# Patient Record
Sex: Female | Born: 1950
Health system: Southern US, Community
[De-identification: ages and names within clinical notes are randomized; demographics above are authoritative.]

## PROBLEM LIST (undated history)

## (undated) DIAGNOSIS — Z780 Asymptomatic menopausal state: Secondary | ICD-10-CM

## (undated) DIAGNOSIS — C801 Malignant (primary) neoplasm, unspecified: Secondary | ICD-10-CM

## (undated) DIAGNOSIS — Z923 Personal history of irradiation: Secondary | ICD-10-CM

## (undated) HISTORY — PX: ABDOMINAL HYSTERECTOMY: SHX81

## (undated) HISTORY — PX: TUBAL LIGATION: SHX77

## (undated) HISTORY — DX: Asymptomatic menopausal state: Z78.0

## (undated) HISTORY — DX: Personal history of irradiation: Z92.3

## (undated) HISTORY — DX: Malignant (primary) neoplasm, unspecified: C80.1

---

## 1989-03-28 HISTORY — PX: BUNIONECTOMY: SHX129

## 2004-08-11 ENCOUNTER — Other Ambulatory Visit: Admission: RE | Admit: 2004-08-11 | Discharge: 2004-08-11 | Payer: Self-pay | Admitting: Gynecology

## 2011-06-01 ENCOUNTER — Other Ambulatory Visit (HOSPITAL_COMMUNITY)
Admission: RE | Admit: 2011-06-01 | Discharge: 2011-06-01 | Disposition: A | Discharge status: Home / Self Care | Payer: BC Managed Care – PPO | Source: Ambulatory Visit | Attending: Gynecology | Admitting: Gynecology

## 2011-06-01 ENCOUNTER — Encounter: Payer: Self-pay | Admitting: Gynecology

## 2011-06-01 ENCOUNTER — Ambulatory Visit (INDEPENDENT_AMBULATORY_CARE_PROVIDER_SITE_OTHER): Payer: BC Managed Care – PPO | Admitting: Gynecology

## 2011-06-01 VITALS — BP 122/70 | Ht 63.75 in | Wt 125.0 lb

## 2011-06-01 DIAGNOSIS — N950 Postmenopausal bleeding: Secondary | ICD-10-CM

## 2011-06-01 DIAGNOSIS — Z1159 Encounter for screening for other viral diseases: Secondary | ICD-10-CM | POA: Insufficient documentation

## 2011-06-01 DIAGNOSIS — Z01419 Encounter for gynecological examination (general) (routine) without abnormal findings: Secondary | ICD-10-CM | POA: Insufficient documentation

## 2011-06-01 DIAGNOSIS — Z124 Encounter for screening for malignant neoplasm of cervix: Secondary | ICD-10-CM

## 2011-06-01 MED ORDER — MEGESTROL ACETATE 40 MG PO TABS: 40.0000 mg | ORAL_TABLET | Freq: Two times a day (BID) | ORAL | Status: AC

## 2011-06-01 NOTE — Progress Notes (Signed)
Patient is a 61 year old new patient to the practice that came as a result of postmenopausal bleeding which started yesterday. Patient has not seen a doctor in close to 10 years. Patient with no prior mammograms, colonoscopy, or bone density study. Patient does her monthly self breast examination. Patient currently on no medication. Patient denies any change in appetite or weight changes.  We discussed importance of part of her evaluation to include an endometrial biopsy in the office today with a followup sonohysterogram next week in the office.  Pelvic exam: Bartholin urethra Skene glands: Within normal limits Vagina: Atrophic changes some blood with mucus found in the vaginal vault Cervix: Blood-tinged mucoid material was present Uterus: Anteverted normal size shape and consistency Adnexa: No palpable masses or tenderness Rectal: Not examined no external lesions seen.  The cervix was cleansed with Betadine solution. A single-tooth tenaculum was placed on the anterior cervical lip. The uterus sounded to 6-1/2 cm. The Pipelle was introduced into the intrauterine cavity and moderate amount of tissue was obtained and submitted for histological evaluation. Patient tolerated procedure well and was given Aleve to take for cramping afterwards. Patient will return back next week for sonohysterogram. And she will be prescribed Megace to take 40 mg one by mouth twice a day for 7 days.  Assessment/plan: Postmenopausal bleeding etiology pending results of endometrial biopsy done today and sonohysterogram next week. Prescribed Megace 40 mg one by mouth twice a day for 7 days to stop her bleeding. Requisition to schedule mammogram was provided. At next visit will give her names of gastroenterologist for a scheduled colonoscopy as well as to schedule a bone density study here in the office. We did do a Pap smear today. All questions answered we'll follow accordingly.

## 2011-06-06 ENCOUNTER — Telehealth: Payer: Self-pay | Admitting: Gynecology

## 2011-06-06 ENCOUNTER — Other Ambulatory Visit: Payer: Self-pay | Admitting: Gynecology

## 2011-06-06 DIAGNOSIS — C55 Malignant neoplasm of uterus, part unspecified: Secondary | ICD-10-CM

## 2011-06-06 NOTE — Telephone Encounter (Signed)
I contacted Tracey Oneill this afternoon to inform her the results of her recent endometrial biopsy done in the office on March 6 as a result of her postmenopausal bleeding. The pathology report was as follows:  FINAL DIAGNOSIS Diagnosis Endometrium, biopsy - POSITIVE FOR ADENOCARCINOMA  I have discussed this case with Dr. Hazle Coca GYN oncologist at [facility] and he is going to follow her care for her staging procedure. She would need a chest x-ray PA and lateral along with abdominal pelvic CT scan as part of her staging preoperatively. I discussed this with Tracey Oneill and informed her that our office is making all the arrangements and we'll be in contact with her within the next couple of days. All questions rancher will follow accordingly.

## 2011-06-07 ENCOUNTER — Telehealth: Payer: Self-pay | Admitting: *Deleted

## 2011-06-07 ENCOUNTER — Other Ambulatory Visit: Payer: Self-pay | Admitting: *Deleted

## 2011-06-07 NOTE — Telephone Encounter (Signed)
Lm for patient to call.  Have appt for CT set at [facility] on 06/10/11 @ 11am.  Will need to be NPO 4 hours prior, need to pick up contrast prior to appt.  Will need to drink contrast 9am, and 10am. Will confirm with patient before scheduling appt with Dr. Noland Fordyce office.

## 2011-06-07 NOTE — Progress Notes (Signed)
Addended byValeda Malm L on: 06/07/2011 10:15 AM   Modules accepted: Orders

## 2011-06-07 NOTE — Telephone Encounter (Signed)
Message copied by Libby Maw on Tue Jun 07, 2011 10:35 AM ------      Message from: Ok Edwards      Created: Mon Jun 06, 2011  3:55 PM       Mychael Smock, I have spoken with this patient today to inform her of her recent diagnosis adenocarcinoma of the uterus. I've also spoken  with Dr. Hazle Coca GYN oncologist at [facility] and his office is waiting to hear from [place] so they can make the appointment for next week. Please schedule a chest x-ray PA and lateral along with abdominal pelvic CT scan with and without contrast for this patient as part of the staging procedure. Patient also waiting to hear from [place] for the studies as well as a followup appointment with Hazle Coca. Thanks

## 2011-06-07 NOTE — Telephone Encounter (Signed)
Patient informed appt information.  Faxed records to Dr. Noland Fordyce office awaiting appt time.

## 2011-06-08 ENCOUNTER — Other Ambulatory Visit: Payer: Self-pay | Admitting: *Deleted

## 2011-06-08 DIAGNOSIS — C55 Malignant neoplasm of uterus, part unspecified: Secondary | ICD-10-CM

## 2011-06-08 NOTE — Telephone Encounter (Signed)
appt set up with Dr. Tresa Endo on 06/20/11 @ 10:45.

## 2011-06-09 ENCOUNTER — Ambulatory Visit: Payer: Self-pay | Admitting: Women's Health

## 2011-06-10 ENCOUNTER — Ambulatory Visit (HOSPITAL_COMMUNITY)
Admission: RE | Admit: 2011-06-10 | Discharge: 2011-06-10 | Disposition: A | Discharge status: Home / Self Care | Payer: BC Managed Care – PPO | Source: Ambulatory Visit | Attending: Gynecology | Admitting: Gynecology

## 2011-06-10 DIAGNOSIS — N9489 Other specified conditions associated with female genital organs and menstrual cycle: Secondary | ICD-10-CM | POA: Insufficient documentation

## 2011-06-10 DIAGNOSIS — K7689 Other specified diseases of liver: Secondary | ICD-10-CM | POA: Insufficient documentation

## 2011-06-10 DIAGNOSIS — C55 Malignant neoplasm of uterus, part unspecified: Secondary | ICD-10-CM

## 2011-06-10 MED ORDER — IOHEXOL 300 MG/ML  SOLN
100.0000 mL | Freq: Once | INTRAMUSCULAR | Status: AC | PRN
  Administered 2011-06-10: 100 mL via INTRAVENOUS

## 2011-06-21 ENCOUNTER — Telehealth: Payer: Self-pay | Admitting: *Deleted

## 2011-06-21 NOTE — Telephone Encounter (Signed)
Pt called stating she has been having problems with sleeping and would like rx? Left message on pt vm to speak with JF about this on OV 06/23/10.

## 2011-06-23 ENCOUNTER — Ambulatory Visit: Payer: BC Managed Care – PPO | Admitting: Gynecology

## 2011-06-23 ENCOUNTER — Ambulatory Visit: Payer: BC Managed Care – PPO

## 2011-07-19 ENCOUNTER — Encounter: Payer: Self-pay | Admitting: Cardiovascular Disease

## 2011-07-19 ENCOUNTER — Ambulatory Visit (INDEPENDENT_AMBULATORY_CARE_PROVIDER_SITE_OTHER): Payer: BC Managed Care – PPO | Admitting: Cardiovascular Disease

## 2011-07-19 VITALS — BP 123/75 | HR 95 | Ht 62.0 in | Wt 121.0 lb

## 2011-07-19 DIAGNOSIS — C55 Malignant neoplasm of uterus, part unspecified: Secondary | ICD-10-CM

## 2011-07-19 DIAGNOSIS — Z01810 Encounter for preprocedural cardiovascular examination: Secondary | ICD-10-CM | POA: Insufficient documentation

## 2011-07-19 NOTE — Assessment & Plan Note (Signed)
She has no signs or symptoms of angina, CHF or arrythmias. She is very active at work and at home. Her physical examination is completely normal. EKG shows normal sinus rhythm with incomplete LBBB. This does not suggest ischemia. I have discussed an echocardiogram in the future to assess LV size and function, exclude structural heart disease. This does not have to be done before her surgical procedure. Her QT interval is prolonged. She is not on any provoking medications. I would avoid any QT prolonging agents with anesthesia or in the post-operative period. I will see her back as needed.

## 2011-07-19 NOTE — Progress Notes (Signed)
   History of Present Illness: 61 yo WF with h/o scarlet fever but no other chronic medical problems who is referred today for pre-operative risk assessment. She was recently diagnosed with uterine cancer. There are plans for hysterectomy in two days at [facility]. She tells me that she is anxious. She works full time at [employer] as a Psychologist, sport and exercise. She has no chest pain, SOB, palpitations, near syncope, syncope, dizziness. She feels well.   Primary Care Physician: None   Past Medical History  Diagnosis Date  . Cancer     malignant neoplasm of corpus uteri except  isthmus  . Postmenopausal     Past Surgical History  Procedure Date  . Bunionectomy 1991    LEFT  . Tubal ligation     No current outpatient prescriptions on file.    Allergies  Allergen Reactions  . Codeine Nausea And Vomiting    History   Social History  . Marital Status: Unknown    Spouse Name: N/A    Number of Children: 2  . Years of Education: N/A   Occupational History  . Nurse tech at [employer]    Social History Main Topics  . Smoking status: Never Smoker   . Smokeless tobacco: Never Used  . Alcohol Use: No  . Drug Use: No  . Sexually Active: No   Other Topics Concern  . Not on file   Social History Narrative  . No narrative on file    Family History  Problem Relation Age of Onset  . Cancer Sister     COLON  . Diabetes Sister     younger  . Diabetes Sister     older  . Stroke Father   . Heart attack Sister 36    Review of Systems:  As stated in the HPI and otherwise negative.   BP 123/75  Pulse 95  Ht 5\' 2"  (1.575 m)  Wt 121 lb (54.885 kg)  BMI 22.13 kg/m2  Physical Examination: General: Well developed, well nourished, NAD HEENT: OP clear, mucus membranes moist SKIN: warm, dry. No rashes. Neuro: No focal deficits Musculoskeletal: Muscle strength 5/5 all ext Psychiatric: Mood and affect normal Neck: No JVD, no carotid bruits, no thyromegaly, no  lymphadenopathy. Lungs:Clear bilaterally, no wheezes, rhonci, crackles Cardiovascular: Regular rate and rhythm. No murmurs, gallops or rubs. Abdomen:Soft. Bowel sounds present. Non-tender.  Extremities: No lower extremity edema. Pulses are 2 + in the bilateral DP/PT.  EKG:NSR, rate 95 bpm.  Incomplete LBBB. QTc 512 ms.

## 2011-07-19 NOTE — Patient Instructions (Signed)
The current medical regimen is effective;  continue present plan and medications.  Follow up as needed 

## 2011-08-23 ENCOUNTER — Telehealth: Payer: Self-pay | Admitting: Oncology

## 2011-08-23 NOTE — Telephone Encounter (Signed)
Talked to pt, gave her appt date and location, gave her telephone number also faxed confirmation to [facility] oncology

## 2011-08-26 ENCOUNTER — Ambulatory Visit (HOSPITAL_BASED_OUTPATIENT_CLINIC_OR_DEPARTMENT_OTHER): Payer: BC Managed Care – PPO | Admitting: Lab

## 2011-08-26 ENCOUNTER — Ambulatory Visit (HOSPITAL_BASED_OUTPATIENT_CLINIC_OR_DEPARTMENT_OTHER): Payer: BC Managed Care – PPO | Admitting: Oncology

## 2011-08-26 ENCOUNTER — Other Ambulatory Visit: Payer: Self-pay | Admitting: *Deleted

## 2011-08-26 ENCOUNTER — Telehealth: Payer: Self-pay | Admitting: Oncology

## 2011-08-26 ENCOUNTER — Ambulatory Visit: Payer: BC Managed Care – PPO

## 2011-08-26 ENCOUNTER — Other Ambulatory Visit: Payer: BC Managed Care – PPO | Admitting: Lab

## 2011-08-26 ENCOUNTER — Encounter: Payer: Self-pay | Admitting: Oncology

## 2011-08-26 VITALS — BP 128/71 | HR 76 | Temp 97.7°F | Ht 62.0 in | Wt 118.2 lb

## 2011-08-26 DIAGNOSIS — C541 Malignant neoplasm of endometrium: Secondary | ICD-10-CM

## 2011-08-26 DIAGNOSIS — C549 Malignant neoplasm of corpus uteri, unspecified: Secondary | ICD-10-CM

## 2011-08-26 DIAGNOSIS — N950 Postmenopausal bleeding: Secondary | ICD-10-CM

## 2011-08-26 LAB — CBC WITH DIFFERENTIAL/PLATELET
BASO%: 0.3 % (ref 0.0–2.0)
Basophils Absolute: 0 10*3/uL (ref 0.0–0.1)
EOS%: 2.2 % (ref 0.0–7.0)
HGB: 12.9 g/dL (ref 11.6–15.9)
MCH: 29.3 pg (ref 25.1–34.0)
MCHC: 32.7 g/dL (ref 31.5–36.0)
MONO#: 0.8 10*3/uL (ref 0.1–0.9)
RDW: 13.1 % (ref 11.2–14.5)
WBC: 10 10*3/uL (ref 3.9–10.3)
lymph#: 2.8 10*3/uL (ref 0.9–3.3)

## 2011-08-26 MED ORDER — DEXAMETHASONE 4 MG PO TABS: ORAL_TABLET | ORAL | Status: DC

## 2011-08-26 MED ORDER — LORAZEPAM 0.5 MG PO TABS: ORAL_TABLET | ORAL | Status: DC

## 2011-08-26 MED ORDER — ONDANSETRON HCL 8 MG PO TABS: 8.0000 mg | ORAL_TABLET | Freq: Two times a day (BID) | ORAL | Status: DC | PRN

## 2011-08-26 NOTE — Progress Notes (Signed)
[facility] NEW PATIENT EVALUATION   Name: Tracey Oneill Date: 08/26/2011 MRN: [id number] DOB: [birth year]  REFERRING PHYSICIAN: Festus Holts, MD (gyn oncology [facility]) Physicians: J.Lily Peer, C.McAlhany   REASON FOR REFERRAL: IIIC endometrial carcinoma, for consideration of adjuvant therapy.     HISTORY OF PRESENT ILLNESS:Tracey Oneill is a 61 y.o. female, seen in consultation for consideration of adjuvant therapy for recently diagnosed IIIC endometrial carcinoma, as she prefers treatment closer to her home in [place]. She is accompanied by friend Clydie Braun. Patient had not had regular medical care in years due to lack of insurance, tho she had seemed very healthy. Just after she obtained insurance coverage she had new onset of postmenopausal bleeding and was seen promptly by Dr Reynaldo Minium on 06-01-11. She had endometrial biopsy, with pathology ([id number]) by [facility] Pathology showing adenocarcinoma with immunohistochemistry favoring endometrial, thought FIGO grade II. She had CT AP at [facility] on 06-10-11, with lung bases clear, no ascites, an ill-defined 2.9 x 3.5 x 3.4 cm mass in right uterine fundus with suspected deep myometrial invasion, no ascites, no retroperitoneal or pelvic adenopathy, no adnexal masses. Other organs unremarkable with exception of 7 mm lesion in anterior right hepatic lobe of uncertain significance. CXR also at [facility] on 06-10-11 unremarkable. She had preoperative cardiology consult by Dr.Christopher McAlhany. She was referred to Dr Festus Holts, with pathology reviewed and confirmed. She had total robotic hysterectomy, BSO and bilateral pelvic lymph node dissection at [facility] 07-21-2011, tolerated well. Pathology [id number]) showed endometroid adenocarcinoma invading outer half of myometrium 2 cm where wall thickness 2.2 cm, FIGO grade 2, no LVSI identified. One of 6 right pelvic nodes and 0 of 7 left pelvic nodes were involved, cervix and  bilateral tubes and ovaries without malignancy. Patient's postoperative course has been unremarkable. She was seen back by Dr Tresa Endo for postoperative visit on 08-08-2011, however the patient cannot tell me what was recommended and does not believe that she has another follow up appointment. Per Dr.Kelly's note, he recommended treatment with chemotherapy and/or radiation therapy, possibly on GOG 258, with patient and family to meet with research nurse and return visit 2 weeks to discuss plan of care. Patient does not recall meeting with research nurse. Patient continues to have some minimal vaginal spotting, not enough to require a pad, and some low pelvic discomfort when she sits for extended time. She has had no fever, is eating regular diet, was constipated x 1 day with bowels now moving daily without laxative or stool softners, no bladder symptoms, no LE swelling. She has some minimal numbness left anterior upper thigh only. 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  REVIEW OF SYSTEMS: otherwise no headaches, good visual acuity with reading glasses, no sinus symptoms, no dental problems, no difficulty hearing, no respiratory symptoms, no cardiac symptoms, has not had mammograms in years, no thyroid symptoms, usual weight usual 118 - 120 lbs with weight loss due to stress before surgery. No GERD symptoms, no nausea or vomiting, no swelling LE, was not on prophylactic anticoagulation. No other bleeding. No history of blood clots. ALLERGIES: Codeine causing nausea/vomiting.  PAST MEDICAL HISTORY: no known medical problems. No mammograms x years, has never had colonoscopy. BTL 37 years ago, bunionectomy 1991, TAH/BSO/pelvic nodes 07-21-11.   CURRENT MEDICATIONS: prn ibuprofen only   SOCIAL HISTORY: originally from [place], in [place] x 22 years. Lives alone, with daughter, son and 3 grandchildren/ 1 step grand all in this area.  Has worked as Psychologist, sport and exercise at  [employer] for 10 years. No cigarettes, no ETOH, no transfusions. Is friend of our previous transcriptionist Clydie Braun.  FAMILY HISTORY: Father died with CVA, mother died with sepsis. Sister with colon ca age 52 treated with surgery only and doing well. Brother died cirrhosis. Sister died cardiac disease and diabetes. 2 other sisters with DM. Daughter age 42 and son age 40 healthy.  LABORATORY DATA:  CBC available after visit: WBC 10.0, ANC 6.1, Hgb 12.9, plt 300k, MCV 89.6, differential not remarkable.  CMET sent and pending  RADIOGRAPHY: CT AP  06-10-11 as above CXR  06-10-11 as above    PHYSICAL EXAM:  height is 5\' 2"  (1.575 m) and weight is 118 lb 3.2 oz (53.615 kg). Her oral temperature is 97.7 F (36.5 C). Her blood pressure is 128/71 and her pulse is 76.  Easily ambulatory, looks comfortable. HEENT: PERRL, not icteric. Normal hair. Oral mucosa moist and clear. Neck supple without thyroid mass or JVD. No cervical supraclavicular or axillary adenopathy, no inguinal adenopathy. Breasts without masses, skin changes or nipple findings, axillae unremarkable. Lungs clear to auscultation and percussion. Back not tender to palpation. Heart RRR without murmur or gallop. Abdomen soft and nontender, normal bowel sounds, not distended. Incisions for robotic surgery all well-healed. No swelling, cords or tenderness LE. Neuro CN, motor, sensory, cerebellar nonfocal. Skin without rashes or bruising. Peripheral veins look adequate for IV access.   I have talked with patient and her friend about circumstances surrounding the diagnosis, the surgery and pathology information as above and recommendation for adjuvant therapy for this IIIC endometrial carcinoma. We have discussed chemotherapy and radiation in general, including standard treatment off study with 6 cycles of platin/taxane chemotherapy often with radiation. We have also discussed GOG 258 which is also available here. We have discussed outpatient  administration and follow up of chemotherapy, side effects including hair loss, nausea, low blood counts. By completion of our discussion she is in agreement with attending the chemotherapy education class and will consider meeting with our research nurse re GOG study. As she is not working on 08-29-11, we have been able to schedule the chemotherapy education class then, to be followed by another discussion with me. We have called in zofran, ativan (which she used remotely for anxiety) and decadron. We have not made referral to RT and have not set up chemotherapy appointment as yet. Patient was comfortable with this discussion, had her questions answered to her satisfaction and understands that she can call at any time if needed.   IMPRESSION/PLAN:  1.IIIC FIGO grade 2 endometroid adenocarcinoma: post surgery 07-21-11, now for adjuvant therapy. 2. Colon cancer in sister: needs colonoscopy 3.overdue mammograms   Zaliyah Meikle P, MD 08/26/2011 8:13 PM

## 2011-08-26 NOTE — Telephone Encounter (Signed)
Gave pt appt for 08/29/10 MD and chemo class, sent pt to lab today

## 2011-08-26 NOTE — Telephone Encounter (Signed)
Opened in error

## 2011-08-26 NOTE — Patient Instructions (Signed)
Chemotherapy teaching class on Monday 6-3, then Dr Darrold Span will see you to finalize plans. Please bring your work schedule if you have it.  We will call in prescriptions to [facility] [address], for zofran (ondansetron), ativan (lorazepam), decadron (dexamethasone). You'll need to pick these up at least the day before chemotherapy.

## 2011-08-27 LAB — COMPREHENSIVE METABOLIC PANEL
ALT: 8 U/L (ref 0–35)
AST: 14 U/L (ref 0–37)
Albumin: 4.4 g/dL (ref 3.5–5.2)
Alkaline Phosphatase: 75 U/L (ref 39–117)
Potassium: 4.1 mEq/L (ref 3.5–5.3)
Sodium: 140 mEq/L (ref 135–145)
Total Protein: 6.8 g/dL (ref 6.0–8.3)

## 2011-08-29 ENCOUNTER — Other Ambulatory Visit: Payer: BC Managed Care – PPO

## 2011-08-29 ENCOUNTER — Telehealth: Payer: Self-pay | Admitting: Oncology

## 2011-08-29 ENCOUNTER — Encounter: Payer: Self-pay | Admitting: Oncology

## 2011-08-29 ENCOUNTER — Ambulatory Visit (HOSPITAL_BASED_OUTPATIENT_CLINIC_OR_DEPARTMENT_OTHER): Payer: BC Managed Care – PPO | Admitting: Oncology

## 2011-08-29 VITALS — BP 135/66 | HR 75 | Temp 97.3°F | Ht 62.0 in | Wt 119.7 lb

## 2011-08-29 DIAGNOSIS — C541 Malignant neoplasm of endometrium: Secondary | ICD-10-CM

## 2011-08-29 DIAGNOSIS — C549 Malignant neoplasm of corpus uteri, unspecified: Secondary | ICD-10-CM

## 2011-08-29 NOTE — Telephone Encounter (Signed)
Talked to pt, gave her appt for 6/12 with Dr. Nelly Rout @ 1200 noon

## 2011-08-29 NOTE — Progress Notes (Signed)
OFFICE PROGRESS NOTE Date of Visit 08-29-11 Physicians: J.Madaline Savage, C.McAlhany  INTERVAL HISTORY:  Patient is seen, together with friend, to complete planning for adjuvant chemotherapy for endometrial carcinoma. She attended chemotherapy education class this am for standard taxol/ carboplatin chemotherapy, as she has decided that she does not want to consider treatment on protocol GOG 258. She requests all of treatment and follow up be done in [place], so I will also refer to Dr Antony Blackbird in radiation oncology and gyn oncology also in [place]. Just after she obtained medical insurance coverage,  she had new onset of postmenopausal bleeding and was seen promptly by Dr Reynaldo Minium on 06-01-11. She had endometrial biopsy, with pathology ([id number]) by [facility] Pathology showing adenocarcinoma with immunohistochemistry favoring endometrial, thought FIGO grade II. She had CT AP at [facility] on 06-10-11, with lung bases clear, no ascites, an ill-defined 2.9 x 3.5 x 3.4 cm mass in right uterine fundus with suspected deep myometrial invasion, no ascites, no retroperitoneal or pelvic adenopathy, no adnexal masses. Other organs unremarkable with exception of 7 mm lesion in anterior right hepatic lobe of uncertain significance. CXR also at [facility] on 06-10-11 unremarkable. She had preoperative cardiology consult by Dr.Christopher McAlhany. She was referred to Dr Festus Holts in gyn oncology at [facility], with pathology reviewed and confirmed. She had total robotic hysterectomy, BSO and bilateral pelvic lymph node dissection at [facility] 07-21-2011, tolerated well. Pathology [id number]) showed endometroid adenocarcinoma invading outer half of myometrium 2 cm where wall thickness 2.2 cm, FIGO grade 2, no LVSI identified. One of 6 right pelvic nodes and 0 of 7 left pelvic nodes were involved, cervix and bilateral tubes and ovaries without malignancy. Patient's postoperative course has been  unremarkable. She was seen back by Dr Tresa Endo for postoperative visit on 08-08-2011. Per Dr.Kelly's note, he recommended treatment with chemotherapy and/or radiation therapy, possibly on GOG 258, with patient and family to meet with research nurse and return visit 2 weeks to discuss plan of care. Patient did not recall meeting with Dr.Kelly's research nurse and was not aware of any scheduled follow up with [facility] when I saw her in consultation last week.  Patient is anxious about starting chemotherapy and has questions about possible radiation therapy, but otherwise has had no problems since I saw her on 08-26-11. 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 Appetite is improved, still some minimal vaginal spotting, bowels moving, no fever or symptoms of infection. Remainder of 10 point Review of Systems negative. She has brought work schedule thru June. She is off every other weekend and various other days such that we will start chemotherapy on June 14 (off June 14 - 17). I am glad to complete FMLA papers also, which may help as we try to stay on schedule with treatments.  Objective:  Vital signs in last 24 hours:  BP 135/66  Pulse 75  Temp(Src) 97.3 F (36.3 C) (Oral)  Ht 5\' 2"  (1.575 m)  Wt 119 lb 11.2 oz (54.296 kg)  BMI 21.89 kg/m2 Weight is up 1.5 lbs. Easily mobile, looks comfortable. Respirations not labored RA. No LE swelling. Did not repeat physical exam otherwise today.  Lab Results: CBC and CMET from 08-26-11 all entirely normal as noted. Studies/Results:  No results found.  Medications: I have reviewed the patient's current medications. She now has prescriptions for premed decadron for cycle 1, lorazepam and ondansetron for nausea. I have given  her written and oral instructions for premed decadron 20 mg with food 12 hrs and 6 hrs prior to taxol, and for the antiemetics (will use ativan night of rx and zofran am after rx  whether or not any nausea, and otherwise as directed prn). She understands that she can call at any time if needed. I will see her back on 6-17 and 6-25, again coordinating with her days off, or sooner if needed. 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 Will add gCSF, timing depending on counts and taxol aches.  Patient and friend are comfortable with discussion and plan. >50% of visit spent in discussion and coordination of care. Assessment/Plan: 1.IIIC grade 2 endometrial adenocarcinoma: post surgery 07-21-11 and to begin adjuvant taxol/carboplatin on 09-09-11. Referral to RT, timing of RT in relation to chemotherapy to be decided. She would like gyn oncology follow up also in [place], as she lives and works in [place].  2.Colon cancer in sister: needs colonoscopy at least after completion of present treatment 3.overdue mammograms, which still needs to be addressed.  Patient was comfortable with information today and in agreement with plan.  Reece Packer, MD   08/29/2011, 9:37 PM

## 2011-08-29 NOTE — Patient Instructions (Addendum)
First chemotherapy will be Friday June 14 at 1130  12 hours before chemo, at 1130 pm on Thurs June 13, take decadron (dexamethasone, steroid) 5 tablets (=20 mg) with food Again 6 hrs before chemo, at 5:30 AM on June 14, take 5 steroid tablets with food.  Night of chemo, whether or not any nausea, take ativan (lorazepam) at bedtime AM after chemo, whether or not any nausea, take zofran (ondansetron).  You can take additional doses of the nausea medicine per instructions on labels if any nausea.  Call if questions about medicines or anything else   [phone]

## 2011-08-31 ENCOUNTER — Ambulatory Visit
Admission: RE | Admit: 2011-08-31 | Discharge: 2011-08-31 | Disposition: A | Discharge status: Home / Self Care | Payer: BC Managed Care – PPO | Source: Ambulatory Visit | Attending: Radiation Oncology | Admitting: Radiation Oncology

## 2011-08-31 ENCOUNTER — Encounter: Payer: Self-pay | Admitting: Radiation Oncology

## 2011-08-31 VITALS — BP 116/72 | HR 95 | Temp 98.2°F | Resp 18 | Ht 63.5 in | Wt 120.3 lb

## 2011-08-31 DIAGNOSIS — Z510 Encounter for antineoplastic radiation therapy: Secondary | ICD-10-CM | POA: Insufficient documentation

## 2011-08-31 DIAGNOSIS — Z79899 Other long term (current) drug therapy: Secondary | ICD-10-CM | POA: Insufficient documentation

## 2011-08-31 DIAGNOSIS — C541 Malignant neoplasm of endometrium: Secondary | ICD-10-CM

## 2011-08-31 DIAGNOSIS — C549 Malignant neoplasm of corpus uteri, unspecified: Secondary | ICD-10-CM | POA: Insufficient documentation

## 2011-08-31 NOTE — Progress Notes (Signed)
HERE TODAY FOR CONSULT OF ENDOMETRIAL CANCER.  HAD TOTAL HYSTERECTOMY AT [facility] ON July 21, 2011.   NO C/O TODAY, NO PAIN, BOWELS ARE WORKING OK.  SHE IS TO START CHEMO CARBO / TAXOL  NEXT Friday WITH DR. Darrold Span.    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

## 2011-08-31 NOTE — Progress Notes (Signed)
Radiation Oncology         [phone] ________________________________  Initial outpatient Consultation  Name: Tracey Oneill MRN: [id number]  Date: 08/31/2011  DOB: 1950-05-29  CC:No primary provider on file.  Reece Packer, MD   REFERRING PHYSICIAN: Reece Packer, MD  DIAGNOSIS: The encounter diagnosis was Endometrial carcinoma.  HISTORY OF PRESENT ILLNESS::Tracey Oneill is a 61 y.o. female who is   seen out of the courtesy of Dr. Darrold Span for an opinion concerning radiation therapy as part management of patient's advanced endometrial cancer.  The patient presented with a several month history of vaginal bleeding. She was subsequently seen by Dr. Reynaldo Minium and endometrial biopsy revealed adenocarcinoma. . Patient subsequently underwent staging workup with a chest x-ray and CT scan of abdomen and pelvis. A 3.5 cm ill-defined mass within the right uterine fundus was noted with suspected deep myometrial invasion. Patient was referred to Dr. Festus Holts at [facility]. The patient proceeded to undergo total robotic hysterectomy, bilateral salpingo-oophorectomy and bilateral pelvic lymph node dissection. The surgery was performed on 07/21/2011. On pathologic review the patient was found to have an endometrioid adenocarcinoma invading the outer half of the myometrium to a 2 cm thick  where the wall thickness was 2.2 cm. FIGO grade 2. There was no lymphovascular space invasion noted. One out of 6 pelvic lymph nodes on the right showed metastatic disease. There was no spread in 7 left pelvic lymph nodes. Patient had an uneventful postoperative course. She was then referred to Dr. Darrold Span who recommended adjuvant treatment. The patient is now seen radiation oncology for further evaluation.  PREVIOUS RADIATION THERAPY: No  PAST MEDICAL HISTORY:  has a past medical history of Cancer and Postmenopausal.    PAST SURGICAL HISTORY: Past Surgical History  Procedure Date  .  Bunionectomy 1991    LEFT  . Tubal ligation     FAMILY HISTORY: family history includes Cancer in her sister; Diabetes in her sisters; Heart attack (age of onset:58) in her sister; and Stroke in her father.  SOCIAL HISTORY:  reports that she has never smoked. She has never used smokeless tobacco. She reports that she does not drink alcohol or use illicit drugs.  ALLERGIES: Codeine  MEDICATIONS:  Current Outpatient Prescriptions  Medication Sig Dispense Refill  . dexamethasone (DECADRON) 4 MG tablet Take 5 tablets with food 12 hours and 6 hours prior to Taxol treatment.  10 tablet  0  . ibuprofen (ADVIL,MOTRIN) 800 MG tablet Take 800 mg by mouth every 8 (eight) hours as needed.      Marland Kitchen LORazepam (ATIVAN) 0.5 MG tablet Take 1 tablet under the tongue or swallow every 4-6 hours as needed for nausea. Will make drowsy.  20 tablet  0  . ondansetron (ZOFRAN) 8 MG tablet Take 1-2 tablets (8-16 mg total) by mouth every 12 (twelve) hours as needed for nausea. Will not make you sleepy.  30 tablet  0    REVIEW OF SYSTEMS:  A 15 point review of systems is documented in the electronic medical record. This was obtained by the nursing staff. However, I reviewed this with the patient to discuss relevant findings and make appropriate changes. She denies any pelvic or abdominal pain or flank pain. This denies any urination difficulties or bowel complaints. She denies any vaginal bleeding at this time. Patient is back to working full-time. She denies any swelling in her lower extremities. Patient does wears support stockings while working.   PHYSICAL EXAM:  height is  5' 3.5" (1.613 m) and weight is 120 lb 4.8 oz (54.568 kg). Her oral temperature is 98.2 F (36.8 C). Her blood pressure is 116/72 and her pulse is 95. Her respiration is 18.  the pupils are equal round reactive to light. The extraocular eye movements are intact. The tongue is midline. This is secondary infection noted in the oral cavity or posterior  pharynx. Examination of the neck and supraclavicular region reveals no evidence of adenopathy. Axillary areas are free of adenopathy your examination of the lungs with some to be clear. The heart has a regular rhythm and rate. Examination of the abdomen fails to be soft and nontender with normal bowel sounds. There is no obvious hepatosplenomegaly. The inguinal areas are free of adenopathy. Patient has small scars along the abdominal area from her robotic surgery. A pelvic exam as not performed today but will be performed  close to her simulation and treatment. Examination of extremities reveals no significant cyanosis clubbing or edema. On neurological examination motor strength is 5 out of 5 in the proximal distal muscle groups in the upper lower extremities.  LABORATORY DATA:  Lab Results  Component Value Date   WBC 10.0 08/26/2011   HGB 12.9 08/26/2011   HCT 39.4 08/26/2011   MCV 89.6 08/26/2011   PLT 300 08/26/2011   Lab Results  Component Value Date   NA 140 08/26/2011   K 4.1 08/26/2011   CL 107 08/26/2011   CO2 22 08/26/2011   Lab Results  Component Value Date   ALT 8 08/26/2011   AST 14 08/26/2011   ALKPHOS 75 08/26/2011   BILITOT 0.6 08/26/2011     RADIOGRAPHY: as above    IMPRESSION: Stage IIIc endometrial carcinoma. Patient would be at risk for pelvic recurrence and in light of this would recommend postoperative radiation therapy as part of her overall management. I discussed the overall treatment course,side effects and potential long-term toxicities of radiation therapy in the situation with the patient and her friend. She appears to understand and wishes to proceed with planned course of treatment. In addition given the deep myometrial invasion the patient may be at increased risk for vaginal cuff recurrence and I would also recommend consideration for intracavitary brachytherapy treatments as part of her overall management after she has completed her external beam treatments.  PLAN:   The patient. will initially proceed with her adjuvant chemotherapy.  Radiation therapy they may be "sandwiched" in between her chemotherapy cycles  depending on Dr. Precious Reel recommendations.   I spent 60 minutes minutes face to face with the patient and more than 50% of that time was spent in counseling and/or coordination of care.   ------------------------------------------------    Billie Lade, PhD, MD

## 2011-09-01 ENCOUNTER — Telehealth: Payer: Self-pay | Admitting: *Deleted

## 2011-09-01 NOTE — Progress Notes (Signed)
Encounter addended by: Gerlene Burdock on: 09/01/2011  8:47 AM<BR>     Documentation filed: Charges VN

## 2011-09-01 NOTE — Telephone Encounter (Signed)
 Sink called for Dr Roselind Messier. He would like a copy of the pathology report from the robotic hysterectomy performed at [facility] on 07/21/11. He does not see it in EPIC and thinks you might have a copy.

## 2011-09-06 ENCOUNTER — Telehealth: Payer: Self-pay | Admitting: Oncology

## 2011-09-06 NOTE — Telephone Encounter (Signed)
Called pt and left message, reminding her of appt on 6/14th , advised pt to get calendar .

## 2011-09-07 ENCOUNTER — Encounter: Payer: Self-pay | Admitting: Gynecologic Oncology

## 2011-09-07 ENCOUNTER — Ambulatory Visit: Payer: BC Managed Care – PPO | Attending: Gynecologic Oncology | Admitting: Gynecologic Oncology

## 2011-09-07 VITALS — BP 102/68 | HR 70 | Temp 97.8°F | Resp 16 | Ht 63.5 in | Wt 118.0 lb

## 2011-09-07 DIAGNOSIS — Z90710 Acquired absence of both cervix and uterus: Secondary | ICD-10-CM | POA: Insufficient documentation

## 2011-09-07 DIAGNOSIS — C549 Malignant neoplasm of corpus uteri, unspecified: Secondary | ICD-10-CM | POA: Insufficient documentation

## 2011-09-07 DIAGNOSIS — Z79899 Other long term (current) drug therapy: Secondary | ICD-10-CM | POA: Insufficient documentation

## 2011-09-07 DIAGNOSIS — C541 Malignant neoplasm of endometrium: Secondary | ICD-10-CM

## 2011-09-07 DIAGNOSIS — Z9079 Acquired absence of other genital organ(s): Secondary | ICD-10-CM | POA: Insufficient documentation

## 2011-09-07 DIAGNOSIS — C775 Secondary and unspecified malignant neoplasm of intrapelvic lymph nodes: Secondary | ICD-10-CM | POA: Insufficient documentation

## 2011-09-07 NOTE — Progress Notes (Signed)
Consult Note: Gyn-Onc  Consult was requested by Dr. Darrold Oneill for the evaluation of Tracey Oneill 61 y.o. female  CC:  Chief Complaint  Patient presents with  . Endo ca    Follow up    HPI: This is a 61 year old who initially presented to Dr. Reynaldo Oneill on 06/01/2011 with complaints of postmenopausal bleeding. Her history is notable for the absence of any gynecologic evaluations for prior 10 years. At that visit a Pap test and endometrial biopsy were collected. The Pap test return with atypical glandular cells. HPV testing was obtained and was negative for high-risk types. No major biopsy was positive for a FIGO grade 2 adenocarcinoma. A CT scan of the abdomen and pelvis was ordered and performed on 06/10/2011. This was notable for a 7 mm hypoenhancing lesion in the anterior segment of the right hepatic lobe. A 3.5 cm ill-defined mass is appreciated in the right uterine fundus with suspicion for deep myometrial invasion. No suspicious retroperitoneal or abdominal pelvic lymphadenopathy was appreciated. She was referred to Dr. Glenice Oneill M.D. at [facility]. On 07/21/2011 underwent a robotic laparoscopic total hysterectomy bilateral salpingo-oophorectomy and bilateral pelvic lymph node dissection. Final pathology was notable for a FIGO grade 2 endometrioid adenocarcinoma invading 2.0 of 2.2 cm of the myometrium. One of 6 right pelvic lymph nodes was positive for metastatic adenocarcinoma.  There was no evidence of lymph vascular invasion and the cancer is stage IIIc1.  The patient was counseled regarding treatment options which include the sandwich therapy versus consideration for participation in GOG 258. Because she lives in [place] she opted to have her chemotherapy and radiation treatment in the area and has been referred to [place] by Dr. Darrold Oneill.  Since scheduled to receive her first cycle of Taxol carboplatin therapy on 09/09/2011. A consultation is also been obtained  from Dr. Trenton Oneill at with the plan for interval pelvic external beam radiotherapy and vaginal cuff brachytherapy to be performed after 3 cycles of chemotherapy.  3 additional cycles of Taxol carboplatin therapy will be administered after radiotherapy treatment is completed  Performance status: 0  Review of Systems:  Constitutional  Feels well,   Cardiovascular  No chest pain, shortness of breath, or edema  Pulmonary  No cough or wheeze.  Gastro Intestinal  No nausea, vomitting, or diarrhoea. No bright red blood per rectum, no abdominal pain, change in bowel movement, or constipation.  Genito Urinary  No frequency, urgency, dysuria, no vaginal bleeding or discharge  Musculo Skeletal  No myalgia, arthralgia, joint swelling or pain  Neurologic  No weakness, numbness, change in gait,  Psychology  No depression, anxiety, insomnia.    Current Meds:  Outpatient Encounter Prescriptions as of 09/07/2011  Medication Sig Dispense Refill  . dexamethasone (DECADRON) 4 MG tablet Take 5 tablets with food 12 hours and 6 hours prior to Taxol treatment.  10 tablet  0  . ibuprofen (ADVIL,MOTRIN) 800 MG tablet Take 800 mg by mouth every 8 (eight) hours as needed.      Marland Kitchen LORazepam (ATIVAN) 0.5 MG tablet Take 1 tablet under the tongue or swallow every 4-6 hours as needed for nausea. Will make drowsy.  20 tablet  0  . ondansetron (ZOFRAN) 8 MG tablet Take 1-2 tablets (8-16 mg total) by mouth every 12 (twelve) hours as needed for nausea. Will not make you sleepy.  30 tablet  0    Allergy:  Allergies  Allergen Reactions  . Codeine Nausea And Vomiting  Social Hx:   History   Social History  . Marital Status: Unknown    Spouse Name: N/A    Number of Children: 2  . Years of Education: N/A   Occupational History  . Nurse tech at [employer]    Social History Main Topics  . Smoking status: Never Smoker   . Smokeless tobacco: Never Used  . Alcohol Use: No  . Drug Use: No  . Sexually  Active: No   Other Topics Concern  . Not on file   Social History Narrative  . No narrative on file    Past Surgical Hx:  Past Surgical History  Procedure Date  . Bunionectomy 1991    LEFT  . Tubal ligation     Past Medical Hx:  Past Medical History  Diagnosis Date  . Cancer     malignant neoplasm of corpus uteri except  isthmus  . Postmenopausal     Past Gynecological History: G2 P2.  H/O BTL.  No h/o gyn care for 10 years.   Family Hx:  Family History  Problem Relation Age of Onset  . Cancer Sister     COLON  . Diabetes Sister     younger  . Diabetes Sister     older  . Stroke Father   . Heart attack Sister 61    Vitals:  Blood pressure 102/68, pulse 70, temperature 97.8 F (36.6 C), temperature source Oral, resp. rate 16, height 5' 3.5" (1.613 m), weight 118 lb (53.524 kg).  Physical Exam: WD in NAD Neck  Supple NROM, without any enlargements.  Lymph Node Survey No cervical supraclavicular or inguinal adenopathy Cardiovascular  Pulse normal rate, regularity and rhythm. S1 and S2 normal.  Lungs  Clear to auscultation bilateraly, without wheezes/crackles/rhonchi. Good air movement.  Skin  No rash/lesions/breakdown  Psychiatry  Alert and oriented to person, place, and time  Abdomen  Normoactive bowel sounds, abdomen soft, non-tender and obese. Lsc surgical  sites intact without evidence of hernia.  Back No CVA tenderness Genito Urinary  Vulva/vagina: Normal external female genitalia.  No lesions.  Bladder/urethra:  No lesions or masses  Vagina:  Cuff is intact, no discharge or bleeding.  No palpable masses. Extremities  No bilateral cyanosis, clubbing or edema.   Assessment/Plan:  [name]  is a 61 y.o.  year old with stage IIIC1 FIGO grade 2 endometroid cancer.  The patient is scheduled to receive her first cycle of Taxol/carboplatin therapy on September 09, 2011.  Plan is to administer sandwich therapy and she's already been evaluated by  Dr. Antony Oneill.  She will followup with the GYN oncology team after completion of the first half of chemotherapy and prior external beam radiotherapy and vaginal cuff brachytherapy.    Tracey Schimke, MD, PhD 09/07/2011, 4:48 PM

## 2011-09-07 NOTE — Patient Instructions (Addendum)
followup with the GYN oncology team after completion of the first half of chemotherapy and prior external beam radiotherapy and vaginal cuff brachytherapy.

## 2011-09-09 ENCOUNTER — Other Ambulatory Visit (HOSPITAL_BASED_OUTPATIENT_CLINIC_OR_DEPARTMENT_OTHER): Payer: BC Managed Care – PPO | Admitting: Lab

## 2011-09-09 ENCOUNTER — Ambulatory Visit (HOSPITAL_BASED_OUTPATIENT_CLINIC_OR_DEPARTMENT_OTHER): Payer: BC Managed Care – PPO

## 2011-09-09 VITALS — BP 132/70 | HR 89 | Temp 97.2°F

## 2011-09-09 DIAGNOSIS — Z5111 Encounter for antineoplastic chemotherapy: Secondary | ICD-10-CM

## 2011-09-09 DIAGNOSIS — C541 Malignant neoplasm of endometrium: Secondary | ICD-10-CM

## 2011-09-09 DIAGNOSIS — C549 Malignant neoplasm of corpus uteri, unspecified: Secondary | ICD-10-CM

## 2011-09-09 LAB — CBC WITH DIFFERENTIAL/PLATELET
BASO%: 0.3 % (ref 0.0–2.0)
Basophils Absolute: 0 10*3/uL (ref 0.0–0.1)
EOS%: 0.2 % (ref 0.0–7.0)
MCH: 29.6 pg (ref 25.1–34.0)
MCHC: 34.5 g/dL (ref 31.5–36.0)
MCV: 86 fL (ref 79.5–101.0)
MONO%: 0.5 % (ref 0.0–14.0)
RBC: 4.49 10*6/uL (ref 3.70–5.45)
RDW: 12.9 % (ref 11.2–14.5)
nRBC: 0 % (ref 0–0)

## 2011-09-09 LAB — COMPREHENSIVE METABOLIC PANEL
Alkaline Phosphatase: 82 U/L (ref 39–117)
Glucose, Bld: 155 mg/dL — ABNORMAL HIGH (ref 70–99)
Sodium: 141 mEq/L (ref 135–145)
Total Bilirubin: 0.4 mg/dL (ref 0.3–1.2)
Total Protein: 7.5 g/dL (ref 6.0–8.3)

## 2011-09-09 MED ORDER — DIPHENHYDRAMINE HCL 50 MG/ML IJ SOLN
50.0000 mg | Freq: Once | INTRAMUSCULAR | Status: AC
  Administered 2011-09-09: 50 mg via INTRAVENOUS

## 2011-09-09 MED ORDER — PACLITAXEL CHEMO INJECTION 300 MG/50ML
175.0000 mg/m2 | Freq: Once | INTRAVENOUS | Status: AC
  Administered 2011-09-09: 276 mg via INTRAVENOUS
  Filled 2011-09-09: qty 46

## 2011-09-09 MED ORDER — FAMOTIDINE IN NACL 20-0.9 MG/50ML-% IV SOLN
20.0000 mg | Freq: Once | INTRAVENOUS | Status: AC
  Administered 2011-09-09: 20 mg via INTRAVENOUS

## 2011-09-09 MED ORDER — SODIUM CHLORIDE 0.9 % IV SOLN: Freq: Once | INTRAVENOUS | Status: DC

## 2011-09-09 MED ORDER — DEXAMETHASONE SODIUM PHOSPHATE 4 MG/ML IJ SOLN
20.0000 mg | Freq: Once | INTRAMUSCULAR | Status: AC
  Administered 2011-09-09: 20 mg via INTRAVENOUS

## 2011-09-09 MED ORDER — SODIUM CHLORIDE 0.9 % IV SOLN
443.5000 mg | Freq: Once | INTRAVENOUS | Status: AC
  Administered 2011-09-09: 440 mg via INTRAVENOUS
  Filled 2011-09-09: qty 44

## 2011-09-09 MED ORDER — ONDANSETRON 16 MG/50ML IVPB (CHCC)
16.0000 mg | Freq: Once | INTRAVENOUS | Status: AC
  Administered 2011-09-09: 16 mg via INTRAVENOUS

## 2011-09-12 ENCOUNTER — Ambulatory Visit (HOSPITAL_BASED_OUTPATIENT_CLINIC_OR_DEPARTMENT_OTHER): Payer: BC Managed Care – PPO | Admitting: Oncology

## 2011-09-12 ENCOUNTER — Telehealth: Payer: Self-pay | Admitting: *Deleted

## 2011-09-12 ENCOUNTER — Other Ambulatory Visit: Payer: Self-pay | Admitting: Certified Registered Nurse Anesthetist

## 2011-09-12 ENCOUNTER — Encounter: Payer: Self-pay | Admitting: Oncology

## 2011-09-12 VITALS — BP 127/71 | HR 108 | Temp 97.5°F | Ht 63.5 in | Wt 117.8 lb

## 2011-09-12 DIAGNOSIS — C549 Malignant neoplasm of corpus uteri, unspecified: Secondary | ICD-10-CM

## 2011-09-12 DIAGNOSIS — C541 Malignant neoplasm of endometrium: Secondary | ICD-10-CM

## 2011-09-12 NOTE — Telephone Encounter (Signed)
Message copied by Augusto Garbe on Mon Sep 12, 2011  3:47 PM ------      Message from: Sherre Poot      Created: Fri Sep 09, 2011  3:21 PM      Regarding: Chemotherapy follow-u Call      Contact: [phone]       First taxol/carbo.  Dr. Darrold Span.

## 2011-09-12 NOTE — Progress Notes (Signed)
OFFICE PROGRESS NOTE Date of Visit 09-12-11 Physicians: J.Lily Peer, C.McAlhany, (M.Kelly), W.Brewster  INTERVAL HISTORY:  Patient is seen, alone for visit, now having had first cycle of taxol/carboplatin on 09-09-11 for IIIC grade 2 endometrial carcinoma. She has done very well thus far from the chemotherapy. She has now established with Dr Laurette Schimke, as she requested to have care locally in [place].She has also established with Dr Roselind Messier. She should see gyn oncology after first 3 cycles of chemotherapy and before RT. Timing of visit today was particularly to avoid conflict with her work schedule. Just after she obtained medical insurance coverage, she had new onset of postmenopausal bleeding and was seen promptly by Dr Reynaldo Minium on 06-01-11. She had endometrial biopsy, with pathology ([id number]) by [facility] showing adenocarcinoma with immunohistochemistry favoring endometrial, thought FIGO grade II. She had CT AP at [facility] on 06-10-11, with lung bases clear, no ascites, an ill-defined 2.9 x 3.5 x 3.4 cm mass in right uterine fundus with suspected deep myometrial invasion, no ascites, no retroperitoneal or pelvic adenopathy, no adnexal masses. Other organs unremarkable with exception of 7 mm lesion in anterior right hepatic lobe of uncertain significance. CXR also at [facility] on 06-10-11 unremarkable. She had preoperative cardiology consult by Dr.Christopher McAlhany. She was referred to Dr Festus Holts in gyn oncology at [facility], with pathology reviewed and confirmed. She had total robotic hysterectomy, BSO and bilateral pelvic lymph node dissection at [facility] 07-21-2011, tolerated well. Pathology [id number]) showed endometroid adenocarcinoma invading outer half of myometrium 2 cm where wall thickness 2.2 cm, FIGO grade 2, no LVSI identified. One of 6 right pelvic nodes and 0 of 7 left pelvic nodes were involved, cervix and bilateral tubes and ovaries without malignancy.  Patient's postoperative course was been unremarkable. She was seen back by Dr Tresa Endo for postoperative visit on 08-08-2011, apparently recommendation for treatment on GOG 258, which she declined at my new patient consultation. Plan is for taxol/ carboplatin with RT in sandwich fashion.  Patient has done well with cycle 1 thus far, without any nausea, no taxol aches as yet, bowels moving, not noticeable fatigue as yet. She has not noticed taste changes. She felt very energetic day after treatment. Peripheral IV access was easily accomplished. She is to be back at work later this week on usual schedule.No peripheral neuropathy symptoms. Remainder of 10 point Review of Systems negative. Objective:  Vital signs in last 24 hours:  BP 127/71  Pulse 108  Temp 97.5 F (36.4 C) (Oral)  Ht 5' 3.5" (1.613 m)  Wt 117 lb 12.8 oz (53.434 kg)  BMI 20.54 kg/m2 Weight is down 2 lbs. Easily mobile, looks comfortable. No alopecia  HEENT:mucous membranes moist, pharynx normal without lesions PERRL, not icteric LymphaticsCervical, supraclavicular, and axillary nodes normal. No inguinal adenopathy Resp: clear to auscultation bilaterally and normal percussion bilaterally Cardio: regular rate and rhythm GI: soft, non-tender; bowel sounds normal; no masses,  no organomegaly Surgical incisions well healed. Extremities: extremities normal, atraumatic, no cyanosis or edema Neuro:no sensory deficits noted  Lab Results: Labs not repeated just day 4 cycle 1 today, will be done with visit next week. No results found for this basename: WBC:2,HGB:2,HCT:2,PLT:2 in the last 72 hours  BMET No results found for this basename: NA:2,K:2,CL:2,CO2:2,GLUCOSE:2,BUN:2,CREATININE:2,CALCIUM:2 in the last 72 hours  Studies/Results:  No results found.  Medications: I have reviewed the patient's current medications.Nausea meds are just prn now.  Assessment/Plan: 1. IIIC grade 2 endometrial carcinoma: history as above, now day 4  cycle 1 taxol/ carboplatin. I will see her again with cbc on 09-20-11, or sooner if needed. She has not had gCSF as yet. 2.needs screening colonoscopy (family history colon ca sister) and overdue mammograms, which will be set up as soon as possible, tho active cancer treatment is priority just now.  Patient is comfortable with discussion and plan  Reece Packer, MD   09/12/2011, 9:46 PM

## 2011-09-12 NOTE — Telephone Encounter (Signed)
Spoke with patient after MD f/u visit today to see if she forgot anything at f/u.  Denies n/v, diarrhea or constipation.  She just left [employer] and ate lasagna.  Did share with MD that she is having twinges/discomfort in her mid abdominal area and understands this is normal.  Denies any questions.

## 2011-09-12 NOTE — Patient Instructions (Signed)
Fine to have cbc as venipuncture instead of fingerstick 6-21 if you prefer. RN will let you know about blood counts that day

## 2011-09-13 ENCOUNTER — Telehealth: Payer: Self-pay | Admitting: Oncology

## 2011-09-13 ENCOUNTER — Telehealth: Payer: Self-pay | Admitting: *Deleted

## 2011-09-13 NOTE — Telephone Encounter (Signed)
Talked to pt , she  is aware of appt in June and July 2013

## 2011-09-13 NOTE — Telephone Encounter (Signed)
Per staff message, I have scheduled appts. JMW  

## 2011-09-16 ENCOUNTER — Other Ambulatory Visit (HOSPITAL_BASED_OUTPATIENT_CLINIC_OR_DEPARTMENT_OTHER): Payer: BC Managed Care – PPO | Admitting: Lab

## 2011-09-16 ENCOUNTER — Telehealth: Payer: Self-pay

## 2011-09-16 DIAGNOSIS — C541 Malignant neoplasm of endometrium: Secondary | ICD-10-CM

## 2011-09-16 DIAGNOSIS — C549 Malignant neoplasm of corpus uteri, unspecified: Secondary | ICD-10-CM

## 2011-09-16 LAB — CBC WITH DIFFERENTIAL/PLATELET
Basophils Absolute: 0 10*3/uL (ref 0.0–0.1)
Eosinophils Absolute: 0.1 10*3/uL (ref 0.0–0.5)
LYMPH%: 35.8 % (ref 14.0–49.7)
MCV: 90.1 fL (ref 79.5–101.0)
MONO%: 2.4 % (ref 0.0–14.0)
NEUT#: 3 10*3/uL (ref 1.5–6.5)
Platelets: 237 10*3/uL (ref 145–400)
RBC: 3.97 10*6/uL (ref 3.70–5.45)

## 2011-09-16 NOTE — Telephone Encounter (Signed)
Told Tracey Oneill that her wbc was good today ANC 3.0 .  She is able to go to work this weekend.   Tracey Oneill will go by work today to pick up [employer] and bring them to her appt. 09-20-11 and see if Dr. Darrold Span will have her on lite duty during treatments.  Her supervisor has been flexable with her working thus far.

## 2011-09-19 ENCOUNTER — Telehealth: Payer: Self-pay | Admitting: Oncology

## 2011-09-19 NOTE — Telephone Encounter (Signed)
Called pt and left message regarding appt on 6/25 and chemo appt

## 2011-09-20 ENCOUNTER — Other Ambulatory Visit: Payer: Self-pay

## 2011-09-20 ENCOUNTER — Other Ambulatory Visit (HOSPITAL_BASED_OUTPATIENT_CLINIC_OR_DEPARTMENT_OTHER): Payer: BC Managed Care – PPO | Admitting: Lab

## 2011-09-20 ENCOUNTER — Telehealth: Payer: Self-pay | Admitting: Oncology

## 2011-09-20 ENCOUNTER — Encounter: Payer: Self-pay | Admitting: Oncology

## 2011-09-20 ENCOUNTER — Ambulatory Visit (HOSPITAL_BASED_OUTPATIENT_CLINIC_OR_DEPARTMENT_OTHER): Payer: BC Managed Care – PPO | Admitting: Oncology

## 2011-09-20 VITALS — BP 128/71 | HR 99 | Temp 97.5°F | Ht 63.25 in | Wt 119.0 lb

## 2011-09-20 DIAGNOSIS — N950 Postmenopausal bleeding: Secondary | ICD-10-CM

## 2011-09-20 DIAGNOSIS — C549 Malignant neoplasm of corpus uteri, unspecified: Secondary | ICD-10-CM

## 2011-09-20 DIAGNOSIS — C541 Malignant neoplasm of endometrium: Secondary | ICD-10-CM

## 2011-09-20 DIAGNOSIS — D709 Neutropenia, unspecified: Secondary | ICD-10-CM

## 2011-09-20 LAB — CBC WITH DIFFERENTIAL/PLATELET
Basophils Absolute: 0 10*3/uL (ref 0.0–0.1)
Eosinophils Absolute: 0 10*3/uL (ref 0.0–0.5)
HCT: 35.2 % (ref 34.8–46.6)
HGB: 11.8 g/dL (ref 11.6–15.9)
LYMPH%: 61.5 % — ABNORMAL HIGH (ref 14.0–49.7)
MCV: 90.1 fL (ref 79.5–101.0)
MONO%: 18.2 % — ABNORMAL HIGH (ref 0.0–14.0)
NEUT#: 0.5 10*3/uL — ABNORMAL LOW (ref 1.5–6.5)
Platelets: 242 10*3/uL (ref 145–400)

## 2011-09-20 LAB — COMPREHENSIVE METABOLIC PANEL
Alkaline Phosphatase: 69 U/L (ref 39–117)
BUN: 12 mg/dL (ref 6–23)
Creatinine, Ser: 0.74 mg/dL (ref 0.50–1.10)
Glucose, Bld: 108 mg/dL — ABNORMAL HIGH (ref 70–99)
Sodium: 138 mEq/L (ref 135–145)
Total Bilirubin: 0.2 mg/dL — ABNORMAL LOW (ref 0.3–1.2)

## 2011-09-20 MED ORDER — FILGRASTIM 300 MCG/0.5ML IJ SOLN
300.0000 ug | Freq: Once | INTRAMUSCULAR | Status: AC
  Administered 2011-09-20: 300 ug via SUBCUTANEOUS
  Filled 2011-09-20: qty 0.5

## 2011-09-20 MED ORDER — DEXAMETHASONE 4 MG PO TABS: ORAL_TABLET | ORAL | Status: DC

## 2011-09-20 NOTE — Telephone Encounter (Signed)
Gv pt appt for june and july2013 

## 2011-09-20 NOTE — Progress Notes (Signed)
OFFICE PROGRESS NOTE Date of Visit 09-20-11 Physicians: W.Brewster, J.Fernandez, C.McAlhany, J.Kinard  INTERVAL HISTORY:  Patient is seen in continuing attention to adjuvant chemotherapy for IIIC endometrial carcinoma, day 12 cycle 1 taxol/carboplatin. She has not had gCSF to date, but is neutropenic today such that this will be started today and used after subsequent treatments. Patient is actually feeling fairly well today and has had no fever or symptoms of infection.  History is of new onset of postmenopausal bleeding in March 2013, patient  seen promptly by Dr Reynaldo Minium on 06-01-11. She had endometrial biopsy, with pathology ([id number]) by [facility] Pathology showing adenocarcinoma with immunohistochemistry favoring endometrial, thought FIGO grade II. She had CT AP at [facility] on 06-10-11, with lung bases clear, no ascites, an ill-defined 2.9 x 3.5 x 3.4 cm mass in uterine fundus with suspected deep myometrial invasion, no ascites, no retroperitoneal or pelvic adenopathy, no adnexal masses. Other organs unremarkable with exception of 7 mm lesion in anterior right hepatic lobe of uncertain significance. CXR also at [facility] on 06-10-11 unremarkable. She had preoperative cardiology consult by Dr.Christopher McAlhany. She was referred to Dr Festus Holts in gyn oncology at [facility], with pathology reviewed and confirmed. She had total robotic hysterectomy, BSO and bilateral pelvic lymph node dissection at [facility] 07-21-2011, tolerated well. Pathology [id number]) showed endometroid adenocarcinoma invading outer half of myometrium 2 cm where wall thickness 2.2 cm, FIGO grade 2, no LVSI identified. One of 6 right pelvic nodes and 0 of 7 left pelvic nodes were involved, cervix and bilateral tubes and ovaries without malignancy. Patient's postoperative course was been unremarkable. She was seen back by Dr Tresa Endo for postoperative visit on 08-08-2011, apparently recommendation for treatment on GOG 258,  which she declined at my new patient consultation. She has requested all care now in [place] as this is most convenient for her.  Plan is for taxol/ carboplatin with RT in sandwich fashion; she is now established with Dr Antony Blackbird for RT.  On Review of Systems otherwise: no nausea or vomiting and has been eating, no respiratory symptoms, no peripheral neuropathy, no hair loss yet, bowels and bladder ok. She does not seem to have been extremely tired in past few days, tho she has not been working (per usual schedule). No significant taxol aches. Remainder of 10 point Review of Systems negative.  Objective:  Vital signs in last 24 hours:  BP 128/71  Pulse 99  Temp 97.5 F (36.4 C) (Oral)  Ht 5' 3.25" (1.607 m)  Wt 119 lb (53.978 kg)  BMI 20.91 kg/m2 Weight is up 2 lbs. Easily ambulatory, looks comfortable.  HEENT:mucous membranes moist, pharynx normal without lesions PERRL LymphaticsCervical, supraclavicular, and axillary nodes normal.No inguinal adenopathy Resp: clear to auscultation bilaterally and normal percussion bilaterally Cardio: regular rate and rhythm GI: soft, non-tender; bowel sounds normal; no masses,  no organomegaly. Surgical incisions not remarkable. Extremities: extremities normal, atraumatic, no cyanosis or edema Neuro:no sensory deficits noted Skin without rash or ecchymoses   Lab Results:   Basename 09/20/11 1315  WBC 2.6*  HGB 11.8  HCT 35.2  PLT 242   ANC 0.5 Note platelets up from 1 week ago so should be past nadir BMET  Basename 09/20/11 1315  NA 138  K 4.5  CL 109  CO2 23  GLUCOSE 108*  BUN 12  CREATININE 0.74  CALCIUM 8.9  Remainder of full CMET normal with exception of Tbili 0.2 and T prot 5.8  Studies/Results:  No results found.  Medications: I have reviewed the patient's current medications. We have discussed gCSF and she has been given neupogen 300 micrograms today.   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 We will recheck CBC tomorrow with neupogen depending on counts then, and let her know depending on counts if she can work on 6-27. She will be due cycle 2 taxol/carbo on 09-30-11 as long as ANC >=1.5 and plt >=100k; she does use full decadron premed prior to taxol. She will have neulasta on 10-03-11, and I will see her at least on 10-10-11 with labs.  Assessment/Plan: 1. IIIC grade 2 endometrial carcinoma: first 3 cycles of taxol/carboplatin in process, with plans for sandwich  RT prior to additional 3 cycles.   2. Neutropenia today without complications, counts apparently just past nadir so hopefully ANC will recover quickly with a couple of doses of neupogen. Patient has been instructed in eutropenic precautions .  I am glad to complete FMLA papers for her work when those are received. Nelvin Tomb P, MD   09/20/2011, 8:58 PM

## 2011-09-20 NOTE — Patient Instructions (Signed)
Until we know that your good infection-fighting white blood cells are back up, avoid anyone who might be ill and call if you have temperature >= 100.5 or symptoms of infection --  [phone]   Chemotherapy will be 9:30 AM on July 5  Decadron (dexamethasone, steroid)  Five tablets (= 20 mg ) with food 12 hrs before chemo (9:30 pm on July 4) and five tablets with food  6 hrs before chemo (= 3:30 AM on July 5)

## 2011-09-21 ENCOUNTER — Other Ambulatory Visit (HOSPITAL_BASED_OUTPATIENT_CLINIC_OR_DEPARTMENT_OTHER): Payer: BC Managed Care – PPO | Admitting: Lab

## 2011-09-21 ENCOUNTER — Ambulatory Visit: Payer: BC Managed Care – PPO | Admitting: Oncology

## 2011-09-21 ENCOUNTER — Ambulatory Visit: Payer: BC Managed Care – PPO

## 2011-09-21 DIAGNOSIS — C549 Malignant neoplasm of corpus uteri, unspecified: Secondary | ICD-10-CM

## 2011-09-21 DIAGNOSIS — C541 Malignant neoplasm of endometrium: Secondary | ICD-10-CM

## 2011-09-21 LAB — CBC WITH DIFFERENTIAL/PLATELET
Eosinophils Absolute: 0 10*3/uL (ref 0.0–0.5)
HCT: 36.1 % (ref 34.8–46.6)
LYMPH%: 28.5 % (ref 14.0–49.7)
MCHC: 34.2 g/dL (ref 31.5–36.0)
MCV: 90.1 fL (ref 79.5–101.0)
MONO%: 20.2 % — ABNORMAL HIGH (ref 0.0–14.0)
NEUT#: 2.6 10*3/uL (ref 1.5–6.5)
NEUT%: 49.7 % (ref 38.4–76.8)
Platelets: 230 10*3/uL (ref 145–400)
RBC: 4 10*6/uL (ref 3.70–5.45)

## 2011-09-21 NOTE — Progress Notes (Signed)
Patients labs WBC 5.2 and ANC 2.6.  Dr Darrold Span notified and said that she doesn't need  Injection today and that she can go to work tomorrow, doesn't to need to wear her mask, and  That everything else is on schedule.   Patient told this and repeated back all the information.  She was excited about all this info.

## 2011-09-23 ENCOUNTER — Telehealth: Payer: Self-pay

## 2011-09-23 NOTE — Telephone Encounter (Signed)
Spoke with Ms. Gasior and told her that Dr. Darrold Span has not received FMLA papers from her employer.  Gave the fax number to Ms. Amend to Give to her employer.

## 2011-09-26 ENCOUNTER — Telehealth: Payer: Self-pay

## 2011-09-26 NOTE — Telephone Encounter (Signed)
Spoke with [name] and reviewed Decadron premeds 12 and 6 hours prior to treatment 09-30-11 at 0930. [name] stated that her employer's ins. Company handles getting FMLA forms to the physician's office.

## 2011-09-30 ENCOUNTER — Other Ambulatory Visit (HOSPITAL_BASED_OUTPATIENT_CLINIC_OR_DEPARTMENT_OTHER): Payer: BC Managed Care – PPO | Admitting: Lab

## 2011-09-30 ENCOUNTER — Ambulatory Visit (HOSPITAL_BASED_OUTPATIENT_CLINIC_OR_DEPARTMENT_OTHER): Payer: BC Managed Care – PPO

## 2011-09-30 VITALS — BP 138/72 | HR 110 | Temp 97.5°F

## 2011-09-30 DIAGNOSIS — C549 Malignant neoplasm of corpus uteri, unspecified: Secondary | ICD-10-CM

## 2011-09-30 DIAGNOSIS — C541 Malignant neoplasm of endometrium: Secondary | ICD-10-CM

## 2011-09-30 DIAGNOSIS — Z5111 Encounter for antineoplastic chemotherapy: Secondary | ICD-10-CM

## 2011-09-30 LAB — CBC WITH DIFFERENTIAL/PLATELET
BASO%: 0 % (ref 0.0–2.0)
EOS%: 0 % (ref 0.0–7.0)
LYMPH%: 7.7 % — ABNORMAL LOW (ref 14.0–49.7)
MCH: 30 pg (ref 25.1–34.0)
MCHC: 34.7 g/dL (ref 31.5–36.0)
MONO#: 0 10*3/uL — ABNORMAL LOW (ref 0.1–0.9)
MONO%: 0.2 % (ref 0.0–14.0)
NEUT%: 92.1 % — ABNORMAL HIGH (ref 38.4–76.8)
Platelets: 194 10*3/uL (ref 145–400)
RBC: 4.2 10*6/uL (ref 3.70–5.45)
WBC: 10.5 10*3/uL — ABNORMAL HIGH (ref 3.9–10.3)
nRBC: 0 % (ref 0–0)

## 2011-09-30 MED ORDER — FAMOTIDINE IN NACL 20-0.9 MG/50ML-% IV SOLN
20.0000 mg | Freq: Once | INTRAVENOUS | Status: AC
  Administered 2011-09-30: 20 mg via INTRAVENOUS

## 2011-09-30 MED ORDER — SODIUM CHLORIDE 0.9 % IV SOLN
Freq: Once | INTRAVENOUS | Status: AC
  Administered 2011-09-30: 10:00:00 via INTRAVENOUS

## 2011-09-30 MED ORDER — DEXTROSE 5 % IV SOLN
175.0000 mg/m2 | Freq: Once | INTRAVENOUS | Status: AC
  Administered 2011-09-30: 276 mg via INTRAVENOUS
  Filled 2011-09-30: qty 46

## 2011-09-30 MED ORDER — CARBOPLATIN CHEMO INJECTION 600 MG/60ML
469.0000 mg | Freq: Once | INTRAVENOUS | Status: AC
  Administered 2011-09-30: 470 mg via INTRAVENOUS
  Filled 2011-09-30: qty 47

## 2011-09-30 MED ORDER — DIPHENHYDRAMINE HCL 50 MG/ML IJ SOLN
50.0000 mg | Freq: Once | INTRAMUSCULAR | Status: AC
  Administered 2011-09-30: 50 mg via INTRAVENOUS

## 2011-09-30 MED ORDER — DEXAMETHASONE SODIUM PHOSPHATE 4 MG/ML IJ SOLN
20.0000 mg | Freq: Once | INTRAMUSCULAR | Status: AC
  Administered 2011-09-30: 20 mg via INTRAVENOUS

## 2011-09-30 MED ORDER — ONDANSETRON 16 MG/50ML IVPB (CHCC)
16.0000 mg | Freq: Once | INTRAVENOUS | Status: AC
  Administered 2011-09-30: 16 mg via INTRAVENOUS

## 2011-09-30 NOTE — Patient Instructions (Addendum)
[facility] Discharge Instructions for Patients Receiving Chemotherapy  Today you received the following chemotherapy agents Taxol/Carboplatin To help prevent nausea and vomiting after your treatment, we encourage you to take your nausea medication as per Dr. Darrold Span.  If you develop nausea and vomiting that is not controlled by your nausea medication, call the clinic. If it is after clinic hours your family physician or the after hours number for the clinic or go to the Emergency Department.   BELOW ARE SYMPTOMS THAT SHOULD BE REPORTED IMMEDIATELY:  *FEVER GREATER THAN 100.5 F  *CHILLS WITH OR WITHOUT FEVER  NAUSEA AND VOMITING THAT IS NOT CONTROLLED WITH YOUR NAUSEA MEDICATION  *UNUSUAL SHORTNESS OF BREATH  *UNUSUAL BRUISING OR BLEEDING  TENDERNESS IN MOUTH AND THROAT WITH OR WITHOUT PRESENCE OF ULCERS  *URINARY PROBLEMS  *BOWEL PROBLEMS  UNUSUAL RASH Items with * indicate a potential emergency and should be followed up as soon as possible.  . Feel free to call the clinic you have any questions or concerns. The clinic phone number is [phone].   I have been informed and understand all the instructions given to me. I know to contact the clinic, my physician, or go to the Emergency Department if any problems should occur. I do not have any questions at this time, but understand that I may call the clinic during office hours   should I have any questions or need assistance in obtaining follow up care.    __________________________________________  _____________  __________ Signature of Patient or Authorized Representative            Date                   Time    __________________________________________ Nurse's Signature   Pt will call with any problems

## 2011-10-03 ENCOUNTER — Ambulatory Visit (HOSPITAL_BASED_OUTPATIENT_CLINIC_OR_DEPARTMENT_OTHER): Payer: BC Managed Care – PPO

## 2011-10-03 VITALS — BP 108/69 | HR 104 | Temp 98.0°F

## 2011-10-03 DIAGNOSIS — Z5189 Encounter for other specified aftercare: Secondary | ICD-10-CM

## 2011-10-03 DIAGNOSIS — C541 Malignant neoplasm of endometrium: Secondary | ICD-10-CM

## 2011-10-03 DIAGNOSIS — C549 Malignant neoplasm of corpus uteri, unspecified: Secondary | ICD-10-CM

## 2011-10-03 MED ORDER — PEGFILGRASTIM INJECTION 6 MG/0.6ML
6.0000 mg | Freq: Once | SUBCUTANEOUS | Status: AC
  Administered 2011-10-03: 6 mg via SUBCUTANEOUS
  Filled 2011-10-03: qty 0.6

## 2011-10-04 ENCOUNTER — Telehealth: Payer: Self-pay | Admitting: *Deleted

## 2011-10-04 NOTE — Telephone Encounter (Signed)
Notified patient that FMLA forms had been signed by Dr Darrold Span. Pt will pick up on Monday 7/15 when she sees Dr Darrold Span.

## 2011-10-10 ENCOUNTER — Other Ambulatory Visit: Payer: Self-pay

## 2011-10-10 ENCOUNTER — Encounter: Payer: Self-pay | Admitting: Oncology

## 2011-10-10 ENCOUNTER — Other Ambulatory Visit (HOSPITAL_BASED_OUTPATIENT_CLINIC_OR_DEPARTMENT_OTHER): Payer: BC Managed Care – PPO

## 2011-10-10 ENCOUNTER — Ambulatory Visit (HOSPITAL_BASED_OUTPATIENT_CLINIC_OR_DEPARTMENT_OTHER): Payer: BC Managed Care – PPO | Admitting: Oncology

## 2011-10-10 VITALS — BP 106/64 | HR 65 | Temp 98.6°F | Ht 63.25 in | Wt 116.2 lb

## 2011-10-10 DIAGNOSIS — C549 Malignant neoplasm of corpus uteri, unspecified: Secondary | ICD-10-CM

## 2011-10-10 DIAGNOSIS — C541 Malignant neoplasm of endometrium: Secondary | ICD-10-CM

## 2011-10-10 DIAGNOSIS — N950 Postmenopausal bleeding: Secondary | ICD-10-CM

## 2011-10-10 LAB — COMPREHENSIVE METABOLIC PANEL
Albumin: 3.9 g/dL (ref 3.5–5.2)
BUN: 19 mg/dL (ref 6–23)
Calcium: 9.1 mg/dL (ref 8.4–10.5)
Chloride: 110 mEq/L (ref 96–112)
Glucose, Bld: 107 mg/dL — ABNORMAL HIGH (ref 70–99)
Potassium: 3.7 mEq/L (ref 3.5–5.3)

## 2011-10-10 LAB — CBC WITH DIFFERENTIAL/PLATELET
Basophils Absolute: 0 10*3/uL (ref 0.0–0.1)
Eosinophils Absolute: 0 10*3/uL (ref 0.0–0.5)
HCT: 33.7 % — ABNORMAL LOW (ref 34.8–46.6)
HGB: 11.2 g/dL — ABNORMAL LOW (ref 11.6–15.9)
MCV: 90.9 fL (ref 79.5–101.0)
MONO%: 7.9 % (ref 0.0–14.0)
NEUT#: 5.9 10*3/uL (ref 1.5–6.5)
RDW: 13.8 % (ref 11.2–14.5)

## 2011-10-10 MED ORDER — LORAZEPAM 0.5 MG PO TABS: ORAL_TABLET | ORAL | Status: DC

## 2011-10-10 NOTE — Patient Instructions (Signed)
RN will let you know on 7-25 if blood counts are good for you to take premedication steroids and be treated 10-20-11.

## 2011-10-10 NOTE — Progress Notes (Signed)
OFFICE PROGRESS NOTE   10/10/2011   Physicians:W.Brewster, J.Fernandez, C.McAlhany, J.Kinard   INTERVAL HISTORY:  Patient is seen, together with grandson, in continuing attention to her adjuvant therapy for IIIC endometrial carcinoma. She had cycle 2 taxol/carboplatin on 09-30-11, with neulasta on 10-03-11.  History is of new onset of postmenopausal bleeding in March 2013, patient seen promptly by Dr Reynaldo Minium on 06-01-11. She had endometrial biopsy, with pathology ([id number]) by [facility] Pathology showing adenocarcinoma with immunohistochemistry favoring endometrial, thought FIGO grade II. She had CT AP at [facility] on 06-10-11, with lung bases clear, no ascites, an ill-defined 2.9 x 3.5 x 3.4 cm mass in uterine fundus with suspected deep myometrial invasion, no ascites, no retroperitoneal or pelvic adenopathy, no adnexal masses. Other organs unremarkable with exception of 7 mm lesion in anterior right hepatic lobe of uncertain significance. CXR also at [facility] on 06-10-11 unremarkable. She had preoperative cardiology consult by Dr.Christopher McAlhany. She was referred to Dr Festus Holts in gyn oncology at [facility], with pathology reviewed and confirmed. She had total robotic hysterectomy, BSO and bilateral pelvic lymph node dissection at [facility] 07-21-2011, tolerated well. Pathology [id number]) showed endometroid adenocarcinoma invading outer half of myometrium 2 cm where wall thickness 2.2 cm, FIGO grade 2, no LVSI identified. One of 6 right pelvic nodes and 0 of 7 left pelvic nodes were involved, cervix and bilateral tubes and ovaries without malignancy. Patient's postoperative course was been unremarkable. She was seen back by Dr Tresa Endo for postoperative visit on 08-08-2011, apparently recommendation for treatment on GOG 258, which she declined at my new patient consultation. She has requested all care now in [place] as this is most convenient for her. Plan is for taxol/ carboplatin with  RT in sandwich fashion by Dr Antony Blackbird, to begin after cycle 3 chemotherapy.  Patient has had some fatigue still since treatment, and had to leave work early at least twice because of this. She is losing her hair and may want to attend the Look Good Feel Better program. Nausea has been controlled, bowels have been moving, and she has no significant peripheral neuropathy symptoms. She has had no fever or symptoms of infection. She has been eating and drinking fluids. She denies increased shortness of breath, abdominal or pelvic pain, swelling LE. Aching from taxol and neulasta were tolerable. Remainder of 10 point Review of Systems negative.  Objective:  Vital signs in last 24 hours:  BP 106/64  Pulse 65  Temp 98.6 F (37 C) (Oral)  Ht 5' 3.25" (1.607 m)  Wt 116 lb 3.2 oz (52.708 kg)  BMI 20.42 kg/m2 Weight is down 3 lbs.Easily ambulatory, appears comfortable.   HEENT:PERRLA, sclera clear, anicteric and oropharynx clear, no lesions LymphaticsCervical, supraclavicular, and axillary nodes normal.No inguinal adenopathy Resp: clear to auscultation bilaterally and normal percussion bilaterally Cardio: regular rate and rhythm GI: soft, non-tender; bowel sounds normal; no masses,  no organomegaly Surgical incision well-healed Extremities: extremities normal, atraumatic, no cyanosis or edema Neuro:nonfocal Skin without rash or ecchymoses   Lab Results:  Results for orders placed in visit on 10/10/11  CBC WITH DIFFERENTIAL      Component Value Range   WBC 8.8  3.9 - 10.3 10e3/uL   NEUT# 5.9  1.5 - 6.5 10e3/uL   HGB 11.2 (*) 11.6 - 15.9 g/dL   HCT 14.7 (*) 82.9 - 56.2 %   Platelets 156  145 - 400 10e3/uL   MCV 90.9  79.5 - 101.0 fL   MCH 30.3  25.1 - 34.0 pg   MCHC 33.3  31.5 - 36.0 g/dL   RBC 8.29  5.62 - 1.30 10e6/uL   RDW 13.8  11.2 - 14.5 %   lymph# 2.2  0.9 - 3.3 10e3/uL   MONO# 0.7  0.1 - 0.9 10e3/uL   Eosinophils Absolute 0.0  0.0 - 0.5 10e3/uL   Basophils Absolute 0.0   0.0 - 0.1 10e3/uL   NEUT% 66.4  38.4 - 76.8 %   LYMPH% 25.0  14.0 - 49.7 %   MONO% 7.9  0.0 - 14.0 %   EOS% 0.4  0.0 - 7.0 %   BASO% 0.3  0.0 - 2.0 %  COMPREHENSIVE METABOLIC PANEL      Component Value Range   Sodium 141  135 - 145 mEq/L   Potassium 3.7  3.5 - 5.3 mEq/L   Chloride 110  96 - 112 mEq/L   CO2 25  19 - 32 mEq/L   Glucose, Bld 107 (*) 70 - 99 mg/dL   BUN 19  6 - 23 mg/dL   Creatinine, Ser 8.65  0.50 - 1.10 mg/dL   Total Bilirubin 0.3  0.3 - 1.2 mg/dL   Alkaline Phosphatase 99  39 - 117 U/L   AST 16  0 - 37 U/L   ALT 14  0 - 35 U/L   Total Protein 6.1  6.0 - 8.3 g/dL   Albumin 3.9  3.5 - 5.2 g/dL   Calcium 9.1  8.4 - 78.4 mg/dL     Studies/Results:  No results found.  Medications: I have reviewed the patient's current medications and refilled ativan 0.5 mg which she uses prn for nausea.  Patient requests to speak with financial counselors, which we will do today.  Assessment/Plan: 1. IIIC endometrial carcinoma: for cycle 3 taxol/carboplatin on 7-26 as long as ANC at least 1.5 and plt at least 100k by CBC [birth year] ([birth year]). She will have neulasta ~ 7-29. I will set up interim CT AP to be done after #3 chemo and before RT, and return visit to Dr Roselind Messier. I will see her back mid August with CBC/CMET.  2.colon cancer in sibling: needs colonoscopy when present treatment is completed 3.overdue mammograms, which also needs to be addressed at least when this treatment is completed.  Patient is comfortable with plan as above.     Reece Packer, MD   10/10/2011, 9:47 PM

## 2011-10-10 NOTE — Telephone Encounter (Signed)
appts made and printed for pt aom °

## 2011-10-20 ENCOUNTER — Telehealth: Payer: Self-pay | Admitting: *Deleted

## 2011-10-20 ENCOUNTER — Other Ambulatory Visit (HOSPITAL_BASED_OUTPATIENT_CLINIC_OR_DEPARTMENT_OTHER): Payer: BC Managed Care – PPO | Admitting: Lab

## 2011-10-20 DIAGNOSIS — C549 Malignant neoplasm of corpus uteri, unspecified: Secondary | ICD-10-CM

## 2011-10-20 DIAGNOSIS — C541 Malignant neoplasm of endometrium: Secondary | ICD-10-CM

## 2011-10-20 LAB — CBC WITH DIFFERENTIAL/PLATELET
BASO%: 0.3 % (ref 0.0–2.0)
HCT: 32.3 % — ABNORMAL LOW (ref 34.8–46.6)
LYMPH%: 28.7 % (ref 14.0–49.7)
MCH: 30.3 pg (ref 25.1–34.0)
MCHC: 33.3 g/dL (ref 31.5–36.0)
MCV: 91.1 fL (ref 79.5–101.0)
MONO#: 0.8 10*3/uL (ref 0.1–0.9)
MONO%: 8.2 % (ref 0.0–14.0)
NEUT%: 62.4 % (ref 38.4–76.8)
Platelets: 339 10*3/uL (ref 145–400)
RBC: 3.54 10*6/uL — ABNORMAL LOW (ref 3.70–5.45)
WBC: 9.3 10*3/uL (ref 3.9–10.3)

## 2011-10-20 NOTE — Telephone Encounter (Signed)
INFORMED PT. THAT HER TREATMENT TIME TOMORROW IS 10:15AM. ALSO INSTRUCTED PT. HOW TO TAKE HER DEXAMETHASONE. SHE VOICES UNDERSTANDING.

## 2011-10-21 ENCOUNTER — Ambulatory Visit (HOSPITAL_BASED_OUTPATIENT_CLINIC_OR_DEPARTMENT_OTHER): Payer: BC Managed Care – PPO

## 2011-10-21 VITALS — BP 122/68 | HR 107 | Temp 97.4°F

## 2011-10-21 DIAGNOSIS — C549 Malignant neoplasm of corpus uteri, unspecified: Secondary | ICD-10-CM

## 2011-10-21 DIAGNOSIS — C541 Malignant neoplasm of endometrium: Secondary | ICD-10-CM

## 2011-10-21 DIAGNOSIS — Z5111 Encounter for antineoplastic chemotherapy: Secondary | ICD-10-CM

## 2011-10-21 MED ORDER — ONDANSETRON 16 MG/50ML IVPB (CHCC)
16.0000 mg | Freq: Once | INTRAVENOUS | Status: AC
  Administered 2011-10-21: 16 mg via INTRAVENOUS

## 2011-10-21 MED ORDER — DIPHENHYDRAMINE HCL 50 MG/ML IJ SOLN
50.0000 mg | Freq: Once | INTRAMUSCULAR | Status: AC
  Administered 2011-10-21: 50 mg via INTRAVENOUS

## 2011-10-21 MED ORDER — FAMOTIDINE IN NACL 20-0.9 MG/50ML-% IV SOLN
20.0000 mg | Freq: Once | INTRAVENOUS | Status: AC
  Administered 2011-10-21: 20 mg via INTRAVENOUS

## 2011-10-21 MED ORDER — SODIUM CHLORIDE 0.9 % IV SOLN
470.0000 mg | Freq: Once | INTRAVENOUS | Status: AC
  Administered 2011-10-21: 470 mg via INTRAVENOUS
  Filled 2011-10-21: qty 47

## 2011-10-21 MED ORDER — DEXAMETHASONE SODIUM PHOSPHATE 4 MG/ML IJ SOLN
20.0000 mg | Freq: Once | INTRAMUSCULAR | Status: AC
  Administered 2011-10-21: 20 mg via INTRAVENOUS

## 2011-10-21 MED ORDER — PACLITAXEL CHEMO INJECTION 300 MG/50ML
175.0000 mg/m2 | Freq: Once | INTRAVENOUS | Status: AC
  Administered 2011-10-21: 276 mg via INTRAVENOUS
  Filled 2011-10-21: qty 46

## 2011-10-21 MED ORDER — SODIUM CHLORIDE 0.9 % IV SOLN
Freq: Once | INTRAVENOUS | Status: AC
  Administered 2011-10-21: 11:00:00 via INTRAVENOUS

## 2011-10-21 NOTE — Patient Instructions (Signed)
[facility] Discharge Instructions for Patients Receiving Chemotherapy  Today you received the following chemotherapy agents Taxol and Carboplatin.  To help prevent nausea and vomiting after your treatment, we encourage you to take your nausea medication as prescribed.   If you develop nausea and vomiting that is not controlled by your nausea medication, call the clinic. If it is after clinic hours your family physician or the after hours number for the clinic or go to the Emergency Department.   BELOW ARE SYMPTOMS THAT SHOULD BE REPORTED IMMEDIATELY:  *FEVER GREATER THAN 100.5 F  *CHILLS WITH OR WITHOUT FEVER  NAUSEA AND VOMITING THAT IS NOT CONTROLLED WITH YOUR NAUSEA MEDICATION  *UNUSUAL SHORTNESS OF BREATH  *UNUSUAL BRUISING OR BLEEDING  TENDERNESS IN MOUTH AND THROAT WITH OR WITHOUT PRESENCE OF ULCERS  *URINARY PROBLEMS  *BOWEL PROBLEMS  UNUSUAL RASH Items with * indicate a potential emergency and should be followed up as soon as possible.  One of the nurses will contact you 24 hours after your treatment. Please let the nurse know about any problems that you may have experienced. Feel free to call the clinic you have any questions or concerns. The clinic phone number is [phone].   I have been informed and understand all the instructions given to me. I know to contact the clinic, my physician, or go to the Emergency Department if any problems should occur. I do not have any questions at this time, but understand that I may call the clinic during office hours   should I have any questions or need assistance in obtaining follow up care.    __________________________________________  _____________  __________ Signature of Patient or Authorized Representative            Date                   Time    __________________________________________ Nurse's Signature    

## 2011-10-24 ENCOUNTER — Ambulatory Visit (HOSPITAL_BASED_OUTPATIENT_CLINIC_OR_DEPARTMENT_OTHER): Payer: BC Managed Care – PPO

## 2011-10-24 VITALS — BP 118/69 | HR 92 | Temp 97.4°F

## 2011-10-24 DIAGNOSIS — C549 Malignant neoplasm of corpus uteri, unspecified: Secondary | ICD-10-CM

## 2011-10-24 DIAGNOSIS — Z5189 Encounter for other specified aftercare: Secondary | ICD-10-CM

## 2011-10-24 DIAGNOSIS — C541 Malignant neoplasm of endometrium: Secondary | ICD-10-CM

## 2011-10-24 MED ORDER — PEGFILGRASTIM INJECTION 6 MG/0.6ML
6.0000 mg | Freq: Once | SUBCUTANEOUS | Status: AC
  Administered 2011-10-24: 6 mg via SUBCUTANEOUS

## 2011-10-25 ENCOUNTER — Other Ambulatory Visit: Payer: Self-pay | Admitting: Certified Registered Nurse Anesthetist

## 2011-10-26 ENCOUNTER — Ambulatory Visit
Admission: RE | Admit: 2011-10-26 | Discharge: 2011-10-26 | Disposition: A | Discharge status: Home / Self Care | Payer: BC Managed Care – PPO | Source: Ambulatory Visit | Attending: Radiation Oncology | Admitting: Radiation Oncology

## 2011-10-26 ENCOUNTER — Encounter: Payer: Self-pay | Admitting: Radiation Oncology

## 2011-10-26 VITALS — BP 114/76 | HR 54 | Temp 98.1°F | Resp 18 | Wt 114.0 lb

## 2011-10-26 DIAGNOSIS — C541 Malignant neoplasm of endometrium: Secondary | ICD-10-CM

## 2011-10-26 NOTE — Progress Notes (Signed)
Here today for fu new for endometrial cancer.  Has had 3 rounds of chemo and will get 3 more after radiation per pt.  No pain today

## 2011-10-28 ENCOUNTER — Encounter: Payer: Self-pay | Admitting: Radiation Oncology

## 2011-10-28 NOTE — Progress Notes (Signed)
  Radiation Oncology         [phone]) [phone] ________________________________  Name: Tracey Oneill MRN: [id number]  Date: 10/26/2011  DOB: 1951-03-01  Reevaluation note  CC: No primary provider on file.  Reece Packer, MD  Diagnosis:   Stage IIIc endometrial adenocarcinoma  Narrative:  The patient returns today for routine follow-up.  She was initially seen in consultation on 08/31/2011. Since her initial consultation the patient has completed completed 3 cycles of chemotherapy consisting of Taxol/carboplatin. She seems to have tolerated this reasonably well. She is now seen to schedule her pelvic radiation therapy as part of her overall management.  The patient denies any vaginal bleeding,  urination difficulties or bowel complaints. She denies any pain within the pelvis or abdominal area.                            ALLERGIES:  is allergic to codeine.  Meds: Current Outpatient Prescriptions  Medication Sig Dispense Refill  . dexamethasone (DECADRON) 4 MG tablet Take 5 tablets = 20 mg with food 12 hours and 6 hours prior to Taxol treatment.  20 tablet  0  . LORazepam (ATIVAN) 0.5 MG tablet Take 1 tablet under the tongue or swallow every 4-6 hours as needed for nausea. Will make drowsy.  30 tablet  0  . ondansetron (ZOFRAN) 8 MG tablet Take 1-2 tablets (8-16 mg total) by mouth every 12 (twelve) hours as needed for nausea. Will not make you sleepy.  30 tablet  0    Physical Findings: The patient is in no acute distress. Patient is alert and oriented.  weight is 114 lb (51.71 kg). Her oral temperature is 98.1 F (36.7 C). Her blood pressure is 114/76 and her pulse is 54. Her respiration is 18. Marland Kitchen  No palpable cervical supraclavicular or axillary adenopathy. The lungs are clear to auscultation. The heart has regular rhythm and rate. The abdomen is soft and nontender with normal bowel sounds. There is no inguinal adenopathy appreciated. On pelvic examination the external genitalia are  unremarkable. Speculum exam is performed. In the proximal vagina there is a reddened area along the vaginal cuff region which is estimated to be approximately 3-4 mm in size.. On bimanual  examination there no pelvic masses appreciated.  Lab Findings: Lab Results  Component Value Date   WBC 9.3 10/20/2011   HGB 10.7* 10/20/2011   HCT 32.3* 10/20/2011   MCV 91.1 10/20/2011   PLT 339 10/20/2011    @LASTCHEM @  Radiographic Findings: No results found.  Impression:  Stage IIIc endometrial adenocarcinoma. The patient is now ready to begin her pelvic radiation therapy as part of her overall management. This will then be followed by intracavitary brachii therapy treatments. Prior to simulation the patient will undergo a CT scan of abdomen and pelvis. Patient is scheduled for simulation on August 13 at 2 PM. The patient will also be examined again on her simulation today. If she continues to have suspicious changes in the vaginal cuff region,  the patient will also also need to be seen in GYN oncology for examination and possible biopsy.  Plan:  Simulation and planning on 11/08/2011  _____________________________________   Billie Lade, PhD, MD

## 2011-11-04 ENCOUNTER — Telehealth: Payer: Self-pay

## 2011-11-04 NOTE — Telephone Encounter (Signed)
Faxed completed form dated 11-04-11 by Dr. Darrold Span.  Sent a copy to HIM to be scanned into pt.'s emr.

## 2011-11-07 ENCOUNTER — Ambulatory Visit (HOSPITAL_COMMUNITY)
Admission: RE | Admit: 2011-11-07 | Discharge: 2011-11-07 | Disposition: A | Discharge status: Home / Self Care | Payer: BC Managed Care – PPO | Source: Ambulatory Visit | Attending: Oncology | Admitting: Oncology

## 2011-11-07 DIAGNOSIS — K7689 Other specified diseases of liver: Secondary | ICD-10-CM | POA: Insufficient documentation

## 2011-11-07 DIAGNOSIS — N289 Disorder of kidney and ureter, unspecified: Secondary | ICD-10-CM | POA: Insufficient documentation

## 2011-11-07 DIAGNOSIS — I7090 Unspecified atherosclerosis: Secondary | ICD-10-CM | POA: Insufficient documentation

## 2011-11-07 DIAGNOSIS — C541 Malignant neoplasm of endometrium: Secondary | ICD-10-CM

## 2011-11-07 DIAGNOSIS — Z9079 Acquired absence of other genital organ(s): Secondary | ICD-10-CM | POA: Insufficient documentation

## 2011-11-07 DIAGNOSIS — Z9221 Personal history of antineoplastic chemotherapy: Secondary | ICD-10-CM | POA: Insufficient documentation

## 2011-11-07 DIAGNOSIS — C549 Malignant neoplasm of corpus uteri, unspecified: Secondary | ICD-10-CM | POA: Insufficient documentation

## 2011-11-07 DIAGNOSIS — Z90710 Acquired absence of both cervix and uterus: Secondary | ICD-10-CM | POA: Insufficient documentation

## 2011-11-07 MED ORDER — IOHEXOL 300 MG/ML  SOLN
100.0000 mL | Freq: Once | INTRAMUSCULAR | Status: AC | PRN
  Administered 2011-11-07: 100 mL via INTRAVENOUS

## 2011-11-08 ENCOUNTER — Ambulatory Visit
Admission: RE | Admit: 2011-11-08 | Discharge: 2011-11-08 | Disposition: A | Discharge status: Home / Self Care | Payer: BC Managed Care – PPO | Source: Ambulatory Visit | Attending: Radiation Oncology | Admitting: Radiation Oncology

## 2011-11-08 DIAGNOSIS — C541 Malignant neoplasm of endometrium: Secondary | ICD-10-CM

## 2011-11-09 ENCOUNTER — Other Ambulatory Visit (HOSPITAL_BASED_OUTPATIENT_CLINIC_OR_DEPARTMENT_OTHER): Payer: BC Managed Care – PPO | Admitting: Lab

## 2011-11-09 ENCOUNTER — Ambulatory Visit (HOSPITAL_BASED_OUTPATIENT_CLINIC_OR_DEPARTMENT_OTHER): Payer: BC Managed Care – PPO | Admitting: Oncology

## 2011-11-09 VITALS — BP 114/70 | HR 101 | Temp 98.6°F | Resp 20 | Ht 63.25 in | Wt 118.3 lb

## 2011-11-09 DIAGNOSIS — C549 Malignant neoplasm of corpus uteri, unspecified: Secondary | ICD-10-CM

## 2011-11-09 DIAGNOSIS — C541 Malignant neoplasm of endometrium: Secondary | ICD-10-CM

## 2011-11-09 DIAGNOSIS — G609 Hereditary and idiopathic neuropathy, unspecified: Secondary | ICD-10-CM

## 2011-11-09 LAB — COMPREHENSIVE METABOLIC PANEL
ALT: 14 U/L (ref 0–35)
AST: 15 U/L (ref 0–37)
Calcium: 8.9 mg/dL (ref 8.4–10.5)
Chloride: 108 mEq/L (ref 96–112)
Creatinine, Ser: 0.75 mg/dL (ref 0.50–1.10)
Sodium: 142 mEq/L (ref 135–145)
Total Bilirubin: 0.2 mg/dL — ABNORMAL LOW (ref 0.3–1.2)
Total Protein: 6.1 g/dL (ref 6.0–8.3)

## 2011-11-09 LAB — CBC WITH DIFFERENTIAL/PLATELET
BASO%: 0.3 % (ref 0.0–2.0)
EOS%: 0.5 % (ref 0.0–7.0)
HCT: 30.1 % — ABNORMAL LOW (ref 34.8–46.6)
MCH: 30.7 pg (ref 25.1–34.0)
MCHC: 33.4 g/dL (ref 31.5–36.0)
MONO#: 0.7 10*3/uL (ref 0.1–0.9)
NEUT%: 66.2 % (ref 38.4–76.8)
RBC: 3.28 10*6/uL — ABNORMAL LOW (ref 3.70–5.45)
RDW: 17.8 % — ABNORMAL HIGH (ref 11.2–14.5)
WBC: 8.4 10*3/uL (ref 3.9–10.3)
lymph#: 2.1 10*3/uL (ref 0.9–3.3)

## 2011-11-09 NOTE — Patient Instructions (Signed)
Appointments as scheduled. 

## 2011-11-10 ENCOUNTER — Telehealth: Payer: Self-pay | Admitting: Oncology

## 2011-11-10 NOTE — Telephone Encounter (Signed)
Called pt and left message regarding appt for September 2013 lab and MD

## 2011-11-11 ENCOUNTER — Ambulatory Visit: Payer: BC Managed Care – PPO

## 2011-11-12 NOTE — Progress Notes (Signed)
  Radiation Oncology         [phone] ________________________________  Name: Tracey Oneill MRN: [id number]  Date: 11/08/2011  DOB: February 18, 1951  SIMULATION AND TREATMENT PLANNING NOTE  DIAGNOSIS:  Stage IIIC endometrial adenocarcinoma  NARRATIVE:  The patient was brought to the CT Simulation planning suite.  Identity was confirmed.  All relevant records and images related to the planned course of therapy were reviewed.  The patient freely provided informed written consent to proceed with treatment after reviewing the details related to the planned course of therapy. The consent form was witnessed and verified by the simulation staff.  Then, the patient was set-up in a stable reproducible  supine position for radiation therapy.  CT images were obtained.  Surface markings were placed.  The CT images were loaded into the planning software.  Then the target and avoidance structures were contoured.  Treatment planning then occurred.  The radiation prescription was entered and confirmed.  A total of 1 complex treatment devices were fabricated. I have requested : Intensity Modulated Radiotherapy (IMRT) is medically necessary for this case for the following reason:  Small bowel sparing.  The patient is being treated according to RTOG protocol guidelines 0724. Patient however is not on this protocol. She did have a CT scan with and without a full bladder to generate a vaginal ITV.  In reviewing the scans there was very little movement of the vaginal area with a full and empty  bladder. Contrast was placed in the vaginal vault to delineate this structure for  develop of the vaginal CTV and PTV volumes.  PLAN:  The patient will receive 4500 Gy in 25 fractions.  She will be treated with a comfortably full bladder.  ________________________________    Billie Lade, PhD, MD

## 2011-11-13 ENCOUNTER — Encounter: Payer: Self-pay | Admitting: Oncology

## 2011-11-13 NOTE — Progress Notes (Signed)
OFFICE PROGRESS NOTE   11/09/2011  Physicians:W.Brewster, J.Fernandez, C.McAlhany, J.Kinard   INTERVAL HISTORY:  Patient is seen, alone for visit, in continuing attention to her IIIC endometrial carcinoma, having had cycle 3 taxol/carboplatin on 10-21-11 with neulasta on 7-29, and pelvic radiation planned 8-22 thru 12-29-11. We expect to complete last 3 cycles of chemotherapy after external beam RT/  resuming during HDR. She had repeat CT AP on 11-07-11. Patient had new onset of postmenopausal bleeding in March 2013 and was seen promptly by Dr Reynaldo Minium. Pathology of endometrial biopsy ([id number], [facility]) showed adenocarcinoma with immunohistochemistry favoring endometrial, thought FIGO grade II. She had CT AP at [facility] on 06-10-11, with lung bases clear, no ascites, an ill-defined 2.9 x 3.5 x 3.4 cm mass in uterine fundus with suspected deep myometrial invasion, no ascites, no retroperitoneal or pelvic adenopathy, no adnexal masses. Other organs unremarkable with exception of 7 mm lesion in anterior right hepatic lobe of uncertain significance. CXR also at [facility] on 06-10-11 unremarkable. She had preoperative cardiology consult by Dr.Christopher McAlhany. She was referred to Dr Festus Holts in gyn oncology at [facility], with pathology reviewed and confirmed. She had total robotic hysterectomy, BSO and bilateral pelvic lymph node dissection at [facility] 07-21-2011, tolerated well. Pathology [id number]) showed endometroid adenocarcinoma invading outer half of myometrium 2 cm where wall thickness 2.2 cm, FIGO grade 2, no LVSI identified. One of 6 right pelvic nodes and 0 of 7 left pelvic nodes were involved, cervix and bilateral tubes and ovaries without malignancy. Patient's postoperative course was been unremarkable. She was seen back by Dr Tresa Endo for postoperative visit on 08-08-2011, apparently recommendation for treatment on GOG 258, which she declined at my new patient  consultation. She has requested all care now in [place] as this is most convenient for her. She has done generally well with first 3 cycles of chemotherapy, has required gCSF. She had RT simulation 11-08-11.  CT AP from 11-07-11 compared with 06-10-11 had clear lung bases, no ascites or adenopathy, essentially unchanged 1 cm and 2 mm lesions in liver, extensive atherosclerosis, no bony lesions.    Patient had more aching after most recent neulasta, but otherwise no different or otherwise significant side effects. She does notice some slight peripheral neuropathy and will continue to exercise hands and feet in the upcoming chemotherapy break. She is back at work. Appetite is adequated without nausea or vomiting now. Bowels are moving. She has had no fever or symptoms of infection, no bleeding, no swelling LE.  Remainder of 10 point Review of Systems negative.  Objective:  Vital signs in last 24 hours:  BP 114/70  Pulse 101  Temp 98.6 F (37 C) (Oral)  Resp 20  Ht 5' 3.25" (1.607 m)  Wt 118 lb 4.8 oz (53.661 kg)  BMI 20.79 kg/m2 Weight is up 2 lbs. Easily ambulatory, respirations not labored, looks comfortable.  HEENT:PERRLA, sclera clear, anicteric and oropharynx clear, no lesions Alopecia LymphaticsCervical, supraclavicular, and axillary nodes normal.No inguinal adenopathy Resp: clear to auscultation bilaterally and normal percussion bilaterally Cardio: regular rate and rhythm GI: soft, non-tender; bowel sounds normal; no masses,  no organomegaly Extremities: extremities normal, atraumatic, no cyanosis or edema Neuro:slight decrease light touch fingertips Breast:normal without suspicious masses, skin or nipple changes or axillary nodes Skin without rash or ecchymosis Lab Results:  Results for orders placed in visit on 11/09/11  CBC WITH DIFFERENTIAL      Component Value Range   WBC 8.4  3.9 - 10.3  10e3/uL   NEUT# 5.6  1.5 - 6.5 10e3/uL   HGB 10.1 (*) 11.6 - 15.9 g/dL   HCT 81.1  (*) 91.4 - 46.6 %   Platelets 212  145 - 400 10e3/uL   MCV 91.9  79.5 - 101.0 fL   MCH 30.7  25.1 - 34.0 pg   MCHC 33.4  31.5 - 36.0 g/dL   RBC 7.82 (*) 9.56 - 2.13 10e6/uL   RDW 17.8 (*) 11.2 - 14.5 %   lymph# 2.1  0.9 - 3.3 10e3/uL   MONO# 0.7  0.1 - 0.9 10e3/uL   Eosinophils Absolute 0.0  0.0 - 0.5 10e3/uL   Basophils Absolute 0.0  0.0 - 0.1 10e3/uL   NEUT% 66.2  38.4 - 76.8 %   LYMPH% 24.8  14.0 - 49.7 %   MONO% 8.2  0.0 - 14.0 %   EOS% 0.5  0.0 - 7.0 %   BASO% 0.3  0.0 - 2.0 %  COMPREHENSIVE METABOLIC PANEL      Component Value Range   Sodium 142  135 - 145 mEq/L   Potassium 4.1  3.5 - 5.3 mEq/L   Chloride 108  96 - 112 mEq/L   CO2 22  19 - 32 mEq/L   Glucose, Bld 82  70 - 99 mg/dL   BUN 15  6 - 23 mg/dL   Creatinine, Ser 0.86  0.50 - 1.10 mg/dL   Total Bilirubin 0.2 (*) 0.3 - 1.2 mg/dL   Alkaline Phosphatase 91  39 - 117 U/L   AST 15  0 - 37 U/L   ALT 14  0 - 35 U/L   Total Protein 6.1  6.0 - 8.3 g/dL   Albumin 3.9  3.5 - 5.2 g/dL   Calcium 8.9  8.4 - 57.8 mg/dL     Studies/Results:  CT AP 11-07-11 as above Medications: I have reviewed the patient's current medications. No changed Assessment/Plan  1. IIIC endometrial carcinoma: history as above. We appreciate Dr Trina Ao help with the RT. I will see her back towards end of Sept, that appointment hopefully to be set up to coordinate with RT time, CBC and CMET then. 2. Peripheral neuropathy symptoms related to taxol: reassess when closer to resuming chemotherapy 3.atherosclerotic changes on CT 4. Overdue mammograms, which will be done when present treatment is completed 5.family history of colon ca in sibling: needs colonoscopy when present treatment is completed.  Patient was in agreement with plan. Reece Packer, MD

## 2011-11-17 ENCOUNTER — Ambulatory Visit
Admission: RE | Admit: 2011-11-17 | Discharge: 2011-11-17 | Disposition: A | Discharge status: Home / Self Care | Payer: BC Managed Care – PPO | Source: Ambulatory Visit | Attending: Radiation Oncology | Admitting: Radiation Oncology

## 2011-11-17 DIAGNOSIS — C541 Malignant neoplasm of endometrium: Secondary | ICD-10-CM

## 2011-11-17 NOTE — Addendum Note (Signed)
Encounter addended by: Billie Lade, MD on: 11/17/2011  8:12 PM<BR>     Documentation filed: Visit Diagnoses, Notes Section

## 2011-11-17 NOTE — Progress Notes (Signed)
   Department of Radiation Oncology  Phone:  [phone] Fax:        [phone]   Intensity modulated radiation therapy device note:  Today the patient began her radiation therapy directed at the pelvis area. the patient will be treated with helical intensity modulated radiation therapy. The patient will be treated with 9.6 sinogram segments. This constitutes 1 IMRT device.  -----------------------------------  Billie Lade, PhD, MD

## 2011-11-18 ENCOUNTER — Ambulatory Visit
Admission: RE | Admit: 2011-11-18 | Discharge: 2011-11-18 | Disposition: A | Discharge status: Home / Self Care | Payer: BC Managed Care – PPO | Source: Ambulatory Visit | Attending: Radiation Oncology | Admitting: Radiation Oncology

## 2011-11-21 ENCOUNTER — Ambulatory Visit
Admission: RE | Admit: 2011-11-21 | Discharge: 2011-11-21 | Disposition: A | Discharge status: Home / Self Care | Payer: BC Managed Care – PPO | Source: Ambulatory Visit | Attending: Radiation Oncology | Admitting: Radiation Oncology

## 2011-11-22 ENCOUNTER — Ambulatory Visit
Admission: RE | Admit: 2011-11-22 | Discharge: 2011-11-22 | Disposition: A | Discharge status: Home / Self Care | Payer: BC Managed Care – PPO | Source: Ambulatory Visit | Attending: Radiation Oncology | Admitting: Radiation Oncology

## 2011-11-22 ENCOUNTER — Encounter: Payer: Self-pay | Admitting: Radiation Oncology

## 2011-11-22 ENCOUNTER — Other Ambulatory Visit (HOSPITAL_COMMUNITY)
Admission: RE | Admit: 2011-11-22 | Discharge: 2011-11-22 | Disposition: A | Discharge status: Home / Self Care | Payer: BC Managed Care – PPO | Source: Ambulatory Visit | Attending: Radiation Oncology | Admitting: Radiation Oncology

## 2011-11-22 VITALS — BP 118/52 | HR 80 | Temp 97.9°F | Resp 20 | Wt 117.2 lb

## 2011-11-22 DIAGNOSIS — Z01419 Encounter for gynecological examination (general) (routine) without abnormal findings: Secondary | ICD-10-CM | POA: Insufficient documentation

## 2011-11-22 DIAGNOSIS — C541 Malignant neoplasm of endometrium: Secondary | ICD-10-CM

## 2011-11-22 NOTE — Progress Notes (Signed)
[facility]    Radiation Oncology [address] [place]     Maryln Gottron, M.D. [place], [place] [address]               Billie Lade, M.D., Ph.D. Phone: [phone]      Molli Hazard A. Kathrynn Running, M.D. Fax: [phone]      Radene Gunning, M.D., Ph.D.         Lurline Hare, M.D.         Grayland Jack, M.D Weekly Treatment Management Note  Name: Tracey Oneill     MRN: [id number]        CSN: [id number] Date: 11/22/2011      DOB: [birth year]  CC: No primary provider on file.         [place]    Status: Outpatient  Diagnosis: The encounter diagnosis was Endometrial carcinoma.  Current Dose: 7.2 Gy  Current Fraction: 4  Planned Dose: 45.0 Gy  Narrative: Tracey Oneill was seen today for weekly treatment management. The chart was checked and MVCT  were reviewed. She is tolerating her treatment well at this time. She specifically denies any nausea or bowel problems. She denies any urination symptoms, vaginal discharge or bleeding.  On the patient's last pelvic exam I did notice some erythema to the vaginal cuff. The patient  had a pelvic exam today which showed continued erythema at the vaginal cuff.  In light of this issue after consent from the patient I performed a Pap smear of this region. Area did bleed somewhat easily with Pap smear but was easily stopped with the direct pressure.  Neosporin and Codeine Current Outpatient Prescriptions  Medication Sig Dispense Refill  . LORazepam (ATIVAN) 0.5 MG tablet Take 1 tablet under the tongue or swallow every 4-6 hours as needed for nausea. Will make drowsy.  30 tablet  0  . ondansetron (ZOFRAN) 8 MG tablet Take 1-2 tablets (8-16 mg total) by mouth every 12 (twelve) hours as needed for nausea. Will not make you sleepy.  30 tablet  0   Labs:  Lab Results  Component Value Date   WBC 8.4 11/09/2011   HGB 10.1* 11/09/2011   HCT 30.1* 11/09/2011   MCV 91.9 11/09/2011   PLT 212 11/09/2011   Lab Results  Component Value Date   CREATININE 0.75 11/09/2011   BUN 15 11/09/2011   NA 142 11/09/2011   K 4.1 11/09/2011   CL 108 11/09/2011   CO2 22 11/09/2011   Lab Results  Component Value Date   ALT 14 11/09/2011   AST 15 11/09/2011   BILITOT 0.2* 11/09/2011    Physical Examination:  weight is 117 lb 3.2 oz (53.162 kg). Her oral temperature is 97.9 F (36.6 C). Her blood pressure is 118/52 and her pulse is 80. Her respiration is 20.    Wt Readings from Last 3 Encounters:  11/22/11 117 lb 3.2 oz (53.162 kg)  11/09/11 118 lb 4.8 oz (53.661 kg)  10/26/11 114 lb (51.71 kg)     Lungs - Normal respiratory effort, chest expands symmetrically. Lungs are clear to auscultation, no crackles or wheezes.  Heart has regular rhythm and rate  Abdomen is soft and non tender with normal bowel sounds On pelvic examination there is no erythema to the inguinal areas or palpable lymphadenopathy. At the vaginal cuff area the patient was noted to have erythema which is somewhat concerning for possible recurrence. This may have enlarged since my last pelvic exam couple weeks ago. A  Pap smear was performed in this area as well.  Assessment:  Patient tolerating treatments well  Plan: Continue treatment per original radiation prescription.    -----------------------------------  Billie Lade, PhD, MD

## 2011-11-22 NOTE — Progress Notes (Addendum)
Patient alert,oriented x3, steady gait, completed 4/25 rad txs, post sim teaching ,radiation therapy and you book given with my business card, printed schedule,discussed s/e, e/e to report, sees MD/Staff weekly and prn,, can call for any questions, concerns; teach back by patient, no c/o pain, does have increased frequency urine, drinks lots fluids, last drank water 10pm, stated she was up several times last night, suggested she may want to stop drinking water earlier than 10pm 11:57 AM Pap smear obtained by Md Dr. Roselind Messier, sent specimen to lab, patient tolerated well, no c/o pian, just some pressure stated patient Slight bleeding noted 12:15 PM

## 2011-11-23 ENCOUNTER — Ambulatory Visit
Admission: RE | Admit: 2011-11-23 | Discharge: 2011-11-23 | Disposition: A | Discharge status: Home / Self Care | Payer: BC Managed Care – PPO | Source: Ambulatory Visit | Attending: Radiation Oncology | Admitting: Radiation Oncology

## 2011-11-24 ENCOUNTER — Ambulatory Visit
Admission: RE | Admit: 2011-11-24 | Discharge: 2011-11-24 | Disposition: A | Discharge status: Home / Self Care | Payer: BC Managed Care – PPO | Source: Ambulatory Visit | Attending: Radiation Oncology | Admitting: Radiation Oncology

## 2011-11-25 ENCOUNTER — Ambulatory Visit
Admission: RE | Admit: 2011-11-25 | Discharge: 2011-11-25 | Disposition: A | Discharge status: Home / Self Care | Payer: BC Managed Care – PPO | Source: Ambulatory Visit | Attending: Radiation Oncology | Admitting: Radiation Oncology

## 2011-11-29 ENCOUNTER — Ambulatory Visit
Admission: RE | Admit: 2011-11-29 | Discharge: 2011-11-29 | Disposition: A | Discharge status: Home / Self Care | Payer: BC Managed Care – PPO | Source: Ambulatory Visit | Attending: Radiation Oncology | Admitting: Radiation Oncology

## 2011-11-29 ENCOUNTER — Encounter: Payer: Self-pay | Admitting: Radiation Oncology

## 2011-11-29 VITALS — BP 129/74 | HR 97 | Temp 98.2°F | Resp 20 | Wt 118.1 lb

## 2011-11-29 DIAGNOSIS — C549 Malignant neoplasm of corpus uteri, unspecified: Secondary | ICD-10-CM | POA: Insufficient documentation

## 2011-11-29 NOTE — Progress Notes (Signed)
[facility]    Radiation Oncology [address] [place]     Maryln Gottron, M.D. [place], [place] [address]               Billie Lade, M.D., Ph.D. Phone: [phone]      Molli Hazard A. Kathrynn Running, M.D. Fax: [phone]      Radene Gunning, M.D., Ph.D.         Lurline Hare, M.D.         Grayland Jack, M.D Weekly Treatment Management Note  Name: Tracey Oneill     MRN: [id number]        CSN: [id number] Date: 11/29/2011      DOB: 24-Sep-1950  CC: No primary provider on file.         Tracey Oneill    Status: Outpatient  Diagnosis: The encounter diagnosis was Malignant neoplasm of corpus uteri, except isthmus.  Current Dose: 14.4 Gy  Current Fraction: 8  Planned Dose: 45.0 Gy  Narrative: Tracey Oneill was seen today for weekly treatment management. The chart was checked and MVCT  were reviewed. She is doing well with her treatments. Last night this did have some diarrhea related to particular food intake. This was controlled with Imodium. Patient denies any urination difficulties or nausea.  Neosporin and Codeine Current Outpatient Prescriptions  Medication Sig Dispense Refill  . LORazepam (ATIVAN) 0.5 MG tablet Take 1 tablet under the tongue or swallow every 4-6 hours as needed for nausea. Will make drowsy.  30 tablet  0  . ondansetron (ZOFRAN) 8 MG tablet Take 1-2 tablets (8-16 mg total) by mouth every 12 (twelve) hours as needed for nausea. Will not make you sleepy.  30 tablet  0   Labs:  Lab Results  Component Value Date   WBC 8.4 11/09/2011   HGB 10.1* 11/09/2011   HCT 30.1* 11/09/2011   MCV 91.9 11/09/2011   PLT 212 11/09/2011   Lab Results  Component Value Date   CREATININE 0.75 11/09/2011   BUN 15 11/09/2011   NA 142 11/09/2011   K 4.1 11/09/2011   CL 108 11/09/2011   CO2 22 11/09/2011   Lab Results  Component Value Date   ALT 14 11/09/2011   AST 15 11/09/2011   BILITOT 0.2* 11/09/2011    Physical Examination:  weight is 118 lb 1.6 oz (53.57 kg). Her oral  temperature is 98.2 F (36.8 C). Her blood pressure is 129/74 and her pulse is 97. Her respiration is 20.    Wt Readings from Last 3 Encounters:  11/29/11 118 lb 1.6 oz (53.57 kg)  11/22/11 117 lb 3.2 oz (53.162 kg)  11/09/11 118 lb 4.8 oz (53.661 kg)     Lungs - Normal respiratory effort, chest expands symmetrically. Lungs are clear to auscultation, no crackles or wheezes.  Heart has regular rhythm and rate  Abdomen is soft and non tender with normal bowel sounds  Assessment:  Patient tolerating treatments well. Pap smear last week showed no evidence of malignancy.  Plan: Continue treatment per original radiation prescription    -----------------------------------  Billie Lade, PhD, MD

## 2011-11-29 NOTE — Progress Notes (Signed)
patient alert,oriented x3, here weekly rad txs: 8/25 completed, no c/o pain, but had  [employer], meatball sandwich and french fries yesterday and then last night up with diarrhea, finally took 1 imodium  Around 2am, finally resolved, only got 4 hours sleep,  11:09 AM

## 2011-11-30 ENCOUNTER — Ambulatory Visit
Admission: RE | Admit: 2011-11-30 | Discharge: 2011-11-30 | Disposition: A | Discharge status: Home / Self Care | Payer: BC Managed Care – PPO | Source: Ambulatory Visit | Attending: Radiation Oncology | Admitting: Radiation Oncology

## 2011-12-01 ENCOUNTER — Ambulatory Visit
Admission: RE | Admit: 2011-12-01 | Discharge: 2011-12-01 | Disposition: A | Discharge status: Home / Self Care | Payer: BC Managed Care – PPO | Source: Ambulatory Visit | Attending: Radiation Oncology | Admitting: Radiation Oncology

## 2011-12-02 ENCOUNTER — Ambulatory Visit
Admission: RE | Admit: 2011-12-02 | Discharge: 2011-12-02 | Disposition: A | Discharge status: Home / Self Care | Payer: BC Managed Care – PPO | Source: Ambulatory Visit | Attending: Radiation Oncology | Admitting: Radiation Oncology

## 2011-12-05 ENCOUNTER — Ambulatory Visit
Admission: RE | Admit: 2011-12-05 | Discharge: 2011-12-05 | Disposition: A | Discharge status: Home / Self Care | Payer: BC Managed Care – PPO | Source: Ambulatory Visit | Attending: Radiation Oncology | Admitting: Radiation Oncology

## 2011-12-06 ENCOUNTER — Ambulatory Visit
Admission: RE | Admit: 2011-12-06 | Discharge: 2011-12-06 | Disposition: A | Discharge status: Home / Self Care | Payer: BC Managed Care – PPO | Source: Ambulatory Visit | Attending: Radiation Oncology | Admitting: Radiation Oncology

## 2011-12-06 ENCOUNTER — Encounter: Payer: Self-pay | Admitting: Radiation Oncology

## 2011-12-06 VITALS — BP 107/66 | HR 81 | Temp 97.8°F | Resp 20 | Wt 118.0 lb

## 2011-12-06 DIAGNOSIS — C541 Malignant neoplasm of endometrium: Secondary | ICD-10-CM

## 2011-12-06 NOTE — Progress Notes (Signed)
Pt denies pain, loss of appetite, fatigue, diarrhea, nausea, urinary issues. Has rare scant clear vaginal discharge which she states she has had since surgery. She states she continues to work 8 hr shifts.

## 2011-12-06 NOTE — Progress Notes (Signed)
[facility]    Radiation Oncology [address] [place]     Maryln Gottron, M.D. [place], [place] [address]               Billie Lade, M.D., Ph.D. Phone: [phone]      Molli Hazard A. Kathrynn Running, M.D. Fax: [phone]      Radene Gunning, M.D., Ph.D.         Lurline Hare, M.D.         Grayland Jack, M.D Weekly Treatment Management Note  Name: Tracey Oneill     MRN: [id number]        CSN: [id number] Date: 12/06/2011      DOB: [birth year]  CC: No primary provider on file.         [place]    Status: Outpatient  Diagnosis: The encounter diagnosis was Endometrial carcinoma.  Current Dose: 23.4 Gy  Current Fraction: 13  Planned Dose: 45.0 Gy  Narrative: Tracey Oneill was seen today for weekly treatment management. The chart was checked and MVCT  were reviewed. She is having some fatigue at this time however is able to work proximally 6 hours per day. She is having more bowel problems and has  to be very careful with food she eats. Patient would like to see the nutritionist.  I have ordered a nutritional consultation.  Neosporin and Codeine Current Outpatient Prescriptions  Medication Sig Dispense Refill  . LORazepam (ATIVAN) 0.5 MG tablet Take 1 tablet under the tongue or swallow every 4-6 hours as needed for nausea. Will make drowsy.  30 tablet  0  . ondansetron (ZOFRAN) 8 MG tablet Take 1-2 tablets (8-16 mg total) by mouth every 12 (twelve) hours as needed for nausea. Will not make you sleepy.  30 tablet  0   Labs:  Lab Results  Component Value Date   WBC 8.4 11/09/2011   HGB 10.1* 11/09/2011   HCT 30.1* 11/09/2011   MCV 91.9 11/09/2011   PLT 212 11/09/2011   Lab Results  Component Value Date   CREATININE 0.75 11/09/2011   BUN 15 11/09/2011   NA 142 11/09/2011   K 4.1 11/09/2011   CL 108 11/09/2011   CO2 22 11/09/2011   Lab Results  Component Value Date   ALT 14 11/09/2011   AST 15 11/09/2011   BILITOT 0.2* 11/09/2011    Physical Examination:  weight is 118 lb  (53.524 kg). Her oral temperature is 97.8 F (36.6 C). Her blood pressure is 107/66 and her pulse is 81. Her respiration is 20.    Wt Readings from Last 3 Encounters:  12/06/11 118 lb (53.524 kg)  11/29/11 118 lb 1.6 oz (53.57 kg)  11/22/11 117 lb 3.2 oz (53.162 kg)     Lungs - Normal respiratory effort, chest expands symmetrically. Lungs are clear to auscultation, no crackles or wheezes.  Heart has regular rhythm and rate  Abdomen is soft and non tender with normal bowel sounds  Assessment:  Patient tolerating treatments well except for issues as above.  Plan: Continue treatment per original radiation prescription    -----------------------------------  Billie Lade, PhD, MD

## 2011-12-07 ENCOUNTER — Ambulatory Visit
Admission: RE | Admit: 2011-12-07 | Discharge: 2011-12-07 | Disposition: A | Discharge status: Home / Self Care | Payer: BC Managed Care – PPO | Source: Ambulatory Visit | Attending: Radiation Oncology | Admitting: Radiation Oncology

## 2011-12-07 ENCOUNTER — Telehealth: Payer: Self-pay | Admitting: *Deleted

## 2011-12-07 ENCOUNTER — Encounter: Payer: BC Managed Care – PPO | Admitting: Nutrition

## 2011-12-07 NOTE — Telephone Encounter (Signed)
CALLED PATIENT TO INFORM OF NUTRITION APPT. FOR 12-09-11 AT 11:15 AM, SPOKE WITH PATIENT AND SHE IS AWARE OF THIS APPT.

## 2011-12-08 ENCOUNTER — Ambulatory Visit
Admission: RE | Admit: 2011-12-08 | Discharge: 2011-12-08 | Disposition: A | Discharge status: Home / Self Care | Payer: BC Managed Care – PPO | Source: Ambulatory Visit | Attending: Radiation Oncology | Admitting: Radiation Oncology

## 2011-12-09 ENCOUNTER — Ambulatory Visit
Admission: RE | Admit: 2011-12-09 | Discharge: 2011-12-09 | Disposition: A | Discharge status: Home / Self Care | Payer: BC Managed Care – PPO | Source: Ambulatory Visit | Attending: Radiation Oncology | Admitting: Radiation Oncology

## 2011-12-09 ENCOUNTER — Ambulatory Visit: Payer: BC Managed Care – PPO | Admitting: Nutrition

## 2011-12-09 NOTE — Assessment & Plan Note (Signed)
Tracey Oneill is a 61 year old female patient of Dr. Darrold Span and Dr. Roselind Messier diagnosed with endometrial cancer.  The patient is receiving radiation therapy.  MEDICAL HISTORY INCLUDES:  Postmenopause, peripheral neuropathy.  MEDICATIONS INCLUDE:  Ativan and Zofran.  LABORATORIES: Labs were reviewed.  HEIGHT:  63-1/2 inches. WEIGHT:  118 pounds. USUAL BODY WEIGHT:  120 pounds. BMI:  20.73.  The patient is requesting diet education on foods she can eat to avoid diarrhea.  She has many food dislikes and intolerances to include milk, fried food, spicy food.  The patient reports that she is hungry all the time and has been limiting most foods with any fiber at all in them and she needs some options for increasing her food intake.  She is due to finish radiation in approximately 2 weeks.  NUTRITION DIAGNOSIS:  Food and nutrition-related knowledge deficit related to endometrial cancer and associated treatments as evidenced by no prior need for nutrition-related information.  INTERVENTION:  I have educated the patient on a low-fiber diet and reviewed specific foods that she could incorporate into her meals and snacks to provide additional calories and protein.  I have reviewed the diarrhea fact sheet with her.  I have provided specific information on take-out meals that she may tolerate and I have educated her to introduce newer foods slowly and assess tolerance to them.  The patient was able to teach back low-fiber diet.  MONITORING/EVALUATION (GOALS):  The patient will tolerate a low-fiber diet to minimize diarrhea throughout treatment and maintain weight.  NEXT VISIT:  Patient will contact me for questions or concerns.   ______________________________ Tracey Oneill, RD, CSO, LDN Clinical Nutrition Specialist BN/MEDQ  D:  12/09/2011  T:  12/09/2011  Job:  [id number]

## 2011-12-12 ENCOUNTER — Ambulatory Visit
Admission: RE | Admit: 2011-12-12 | Discharge: 2011-12-12 | Disposition: A | Discharge status: Home / Self Care | Payer: BC Managed Care – PPO | Source: Ambulatory Visit | Attending: Radiation Oncology | Admitting: Radiation Oncology

## 2011-12-13 ENCOUNTER — Ambulatory Visit
Admission: RE | Admit: 2011-12-13 | Discharge: 2011-12-13 | Disposition: A | Discharge status: Home / Self Care | Payer: BC Managed Care – PPO | Source: Ambulatory Visit | Attending: Radiation Oncology | Admitting: Radiation Oncology

## 2011-12-13 VITALS — BP 109/63 | HR 93 | Temp 98.5°F | Wt 115.6 lb

## 2011-12-13 DIAGNOSIS — C541 Malignant neoplasm of endometrium: Secondary | ICD-10-CM

## 2011-12-13 NOTE — Progress Notes (Signed)
Patient here for weekly assessment of pelvic radiation.Completed 18 of 25 treatments.Denies pain.Has generalized fatigue. Bowels regular last few days.

## 2011-12-13 NOTE — Progress Notes (Signed)
[facility]    Radiation Oncology [address] [place]     Maryln Gottron, M.D. [place], [place] [address]               Billie Lade, M.D., Ph.D. Phone: [phone]      Molli Hazard A. Kathrynn Running, M.D. Fax: [phone]      Radene Gunning, M.D., Ph.D.         Lurline Hare, M.D.         Grayland Jack, M.D Weekly Treatment Management Note  Name: Tracey Oneill     MRN: [id number]        CSN: [id number] Date: 12/13/2011      DOB: May 07, 1950  CC: No primary provider on file.         [place]ives[place]    Status: Outpatient  Diagnosis: The encounter diagnosis was Endometrial carcinoma.  Current Dose: 3240 cGy  Current Fraction: 18  Planned Dose: 4500 cGy  Narrative: Tracey Oneill was seen today for weekly treatment management. The chart was checked and MVCT  were reviewed. She is having fatigue with her treatments this time. She is back on her work to approximately 3-4 hours per day. She has to be careful with food she eats her otherwise she will have problems with diarrhea.  Neosporin and Codeine  Current Outpatient Prescriptions  Medication Sig Dispense Refill  . LORazepam (ATIVAN) 0.5 MG tablet Take 1 tablet under the tongue or swallow every 4-6 hours as needed for nausea. Will make drowsy.  30 tablet  0  . ondansetron (ZOFRAN) 8 MG tablet Take 1-2 tablets (8-16 mg total) by mouth every 12 (twelve) hours as needed for nausea. Will not make you sleepy.  30 tablet  0   Labs:  Lab Results  Component Value Date   WBC 8.4 11/09/2011   HGB 10.1* 11/09/2011   HCT 30.1* 11/09/2011   MCV 91.9 11/09/2011   PLT 212 11/09/2011   Lab Results  Component Value Date   CREATININE 0.75 11/09/2011   BUN 15 11/09/2011   NA 142 11/09/2011   K 4.1 11/09/2011   CL 108 11/09/2011   CO2 22 11/09/2011   Lab Results  Component Value Date   ALT 14 11/09/2011   AST 15 11/09/2011   BILITOT 0.2* 11/09/2011    Physical Examination:  weight is 115 lb 9.6 oz (52.436 kg). Her temperature is 98.5 F (36.9  C). Her blood pressure is 109/63 and her pulse is 93.    Wt Readings from Last 3 Encounters:  12/13/11 115 lb 9.6 oz (52.436 kg)  12/06/11 118 lb (53.524 kg)  11/29/11 118 lb 1.6 oz (53.57 kg)     Lungs - Normal respiratory effort, chest expands symmetrically. Lungs are clear to auscultation, no crackles or wheezes.  Heart has regular rhythm and rate  Abdomen is soft and non tender with normal bowel sounds  Assessment:  Patient tolerating treatments well  Plan: Continue treatment per original radiation prescription    -----------------------------------  Billie Lade, PhD, MD

## 2011-12-14 ENCOUNTER — Ambulatory Visit
Admission: RE | Admit: 2011-12-14 | Discharge: 2011-12-14 | Disposition: A | Discharge status: Home / Self Care | Payer: BC Managed Care – PPO | Source: Ambulatory Visit | Attending: Radiation Oncology | Admitting: Radiation Oncology

## 2011-12-15 ENCOUNTER — Ambulatory Visit
Admission: RE | Admit: 2011-12-15 | Discharge: 2011-12-15 | Disposition: A | Discharge status: Home / Self Care | Payer: BC Managed Care – PPO | Source: Ambulatory Visit | Attending: Radiation Oncology | Admitting: Radiation Oncology

## 2011-12-16 ENCOUNTER — Ambulatory Visit
Admission: RE | Admit: 2011-12-16 | Discharge: 2011-12-16 | Disposition: A | Discharge status: Home / Self Care | Payer: BC Managed Care – PPO | Source: Ambulatory Visit | Attending: Radiation Oncology | Admitting: Radiation Oncology

## 2011-12-19 ENCOUNTER — Ambulatory Visit
Admission: RE | Admit: 2011-12-19 | Discharge: 2011-12-19 | Disposition: A | Discharge status: Home / Self Care | Payer: BC Managed Care – PPO | Source: Ambulatory Visit | Attending: Radiation Oncology | Admitting: Radiation Oncology

## 2011-12-20 ENCOUNTER — Other Ambulatory Visit: Payer: Self-pay

## 2011-12-20 ENCOUNTER — Encounter: Payer: Self-pay | Admitting: Oncology

## 2011-12-20 ENCOUNTER — Encounter: Payer: Self-pay | Admitting: Radiation Oncology

## 2011-12-20 ENCOUNTER — Ambulatory Visit
Admission: RE | Admit: 2011-12-20 | Discharge: 2011-12-20 | Disposition: A | Discharge status: Home / Self Care | Payer: BC Managed Care – PPO | Source: Ambulatory Visit | Attending: Radiation Oncology | Admitting: Radiation Oncology

## 2011-12-20 ENCOUNTER — Ambulatory Visit (HOSPITAL_BASED_OUTPATIENT_CLINIC_OR_DEPARTMENT_OTHER): Payer: BC Managed Care – PPO | Admitting: Oncology

## 2011-12-20 ENCOUNTER — Other Ambulatory Visit (HOSPITAL_BASED_OUTPATIENT_CLINIC_OR_DEPARTMENT_OTHER): Payer: BC Managed Care – PPO | Admitting: Lab

## 2011-12-20 VITALS — BP 110/62 | HR 72 | Temp 97.5°F | Resp 18 | Ht 63.25 in | Wt 116.2 lb

## 2011-12-20 VITALS — BP 114/54 | HR 73 | Temp 97.7°F | Resp 20 | Wt 116.1 lb

## 2011-12-20 DIAGNOSIS — C541 Malignant neoplasm of endometrium: Secondary | ICD-10-CM

## 2011-12-20 DIAGNOSIS — N950 Postmenopausal bleeding: Secondary | ICD-10-CM

## 2011-12-20 DIAGNOSIS — C549 Malignant neoplasm of corpus uteri, unspecified: Secondary | ICD-10-CM

## 2011-12-20 DIAGNOSIS — Z800 Family history of malignant neoplasm of digestive organs: Secondary | ICD-10-CM

## 2011-12-20 DIAGNOSIS — R5383 Other fatigue: Secondary | ICD-10-CM

## 2011-12-20 LAB — CBC WITH DIFFERENTIAL/PLATELET
Basophils Absolute: 0 10*3/uL (ref 0.0–0.1)
EOS%: 4.1 % (ref 0.0–7.0)
HCT: 31.9 % — ABNORMAL LOW (ref 34.8–46.6)
HGB: 11.2 g/dL — ABNORMAL LOW (ref 11.6–15.9)
LYMPH%: 17.2 % (ref 14.0–49.7)
MCH: 33.7 pg (ref 25.1–34.0)
MCV: 96.2 fL (ref 79.5–101.0)
MONO%: 9.4 % (ref 0.0–14.0)
NEUT%: 69.1 % (ref 38.4–76.8)

## 2011-12-20 LAB — COMPREHENSIVE METABOLIC PANEL (CC13)
AST: 15 U/L (ref 5–34)
Alkaline Phosphatase: 72 U/L (ref 40–150)
BUN: 14 mg/dL (ref 7.0–26.0)
Calcium: 9.4 mg/dL (ref 8.4–10.4)
Creatinine: 0.8 mg/dL (ref 0.6–1.1)

## 2011-12-20 MED ORDER — LORAZEPAM 0.5 MG PO TABS: ORAL_TABLET | ORAL | Status: DC

## 2011-12-20 MED ORDER — DEXAMETHASONE 4 MG PO TABS: ORAL_TABLET | ORAL | Status: DC

## 2011-12-20 NOTE — Patient Instructions (Signed)
Dr Precious Reel office will contact you to set up next chemo and next appointments here after radiation appointments are available.

## 2011-12-20 NOTE — Progress Notes (Signed)
Patient arrived here for weekly rad txs,completed 23/25 ,no c/o pain, is fatigued, working a few hours, grandson shawn in with patient, denies any frequency urgency voiding,no bleeding note, does have diarrhea at times, took imodium last night,resolved, she states"I have to be careful what I eat", had grilled chicken and baked potato last night for dinner, has diarrhea 3x week stated 11:12 AM

## 2011-12-20 NOTE — Progress Notes (Signed)
OFFICE PROGRESS NOTE   12/20/2011   Physicians:W.Brewster, J.Fernandez, C.McAlhany, J.Kinard   INTERVAL HISTORY:  Patient is seen in continuing attention to her IIIC endometrial carcinoma, post surgery 07-21-11, 3 cycles of taxol carboplatin thru 10-21-11 and now  IMRT portion of her radiation therapy in process. I have discussed with Dr Roselind Messier by phone, with HDR x 3 to begin possibly as early as next week. Patient had new onset of postmenopausal bleeding in March 2013 and was seen promptly by Dr Reynaldo Minium. Pathology of endometrial biopsy ([id number], [facility]) showed adenocarcinoma with immunohistochemistry favoring endometrial, FIGO grade II. She had CT AP at [facility] on 06-10-11, with lung bases clear, no ascites, an ill-defined 2.9 x 3.5 x 3.4 cm mass in uterine fundus with suspected deep myometrial invasion, no ascites, no retroperitoneal or pelvic adenopathy, no adnexal masses. Other organs unremarkable with exception of 7 mm lesion in anterior right hepatic lobe of uncertain significance. CXR also at [facility] on 06-10-11 unremarkable. She had preoperative cardiology consult by Dr.Christopher McAlhany. She was referred to Dr Festus Holts in gyn oncology at [facility], with pathology reviewed and confirmed. She had total robotic hysterectomy, BSO and bilateral pelvic lymph node dissection at [facility] 07-21-2011, tolerated well. Pathology [id number]) showed endometroid adenocarcinoma invading outer half of myometrium 2 cm where wall thickness 2.2 cm, FIGO grade 2, no LVSI identified. One of 6 right pelvic nodes and 0 of 7 left pelvic nodes were involved, cervix and bilateral tubes and ovaries without malignancy. Patient's postoperative course was been unremarkable. She was seen back by Dr Tresa Endo for postoperative visit on 08-08-2011, apparently recommendation for treatment on GOG 258, which she declined at my new patient consultation. She has requested all care now in [place] as  this is most convenient for her. She did generally well with first 3 cycles of chemotherapy, did require neulasta. She has also tolerated IMRT generally well thus far.   Patient has had some fatigue ongoing, but has been able to work ~ 3 hours daily on days of radiation and did work all of last weekend. She has to be careful with diet due to some diarrhea, but has needed imodium only ~ 3 times weekly. She has talked with dietician and has lost only ~ 2 lbs with RT; we have discussed diet choices again now (she does not tolerate sweet taste). She is having no nausea or vomiting, no skin reaction, no bladder symptoms, no rectal skin irritation, no fever or symptoms of infection, generally able to sleep, no shortness of breath, no new or different pain. She is not complaining of peripheral neuropathy symptoms now. Remainder of 10 point Review of Systems negative.  Objective:  Vital signs in last 24 hours:  BP 110/62  Pulse 72  Temp 97.5 F (36.4 C) (Oral)  Resp 18  Ht 5' 3.25" (1.607 m)  Wt 116 lb 3.2 oz (52.708 kg)  BMI 20.42 kg/m2 Weight is down ~ 2 lbs from 11-09-11. Easily ambulatory, appears comfortable, respirations not labored RA. Accompanied to office by grandson.    HEENT:PERRLA, sclera clear, anicteric, oropharynx clear, no lesions and neck supple with midline trachea. Complete alopecia. LymphaticsCervical, supraclavicular, and axillary nodes normal. No inguinal adenopathy Resp: clear to auscultation bilaterally and normal percussion bilaterally Cardio: regular rate and rhythm GI: soft, non-tender; bowel sounds normal; no masses,  no organomegaly Extremities: extremities normal, atraumatic, no cyanosis or edema Neuro: nonfocal Skin without rash or petechiae.  Lab Results:  Results for orders placed in  visit on 12/20/11  CBC WITH DIFFERENTIAL      Component Value Range   WBC 3.5 (*) 3.9 - 10.3 10e3/uL   NEUT# 2.4  1.5 - 6.5 10e3/uL   HGB 11.2 (*) 11.6 - 15.9 g/dL   HCT 16.1  (*) 09.6 - 46.6 %   Platelets 277  145 - 400 10e3/uL   MCV 96.2  79.5 - 101.0 fL   MCH 33.7  25.1 - 34.0 pg   MCHC 35.0  31.5 - 36.0 g/dL   RBC 0.45 (*) 4.09 - 8.11 10e6/uL   RDW 14.5  11.2 - 14.5 %   lymph# 0.6 (*) 0.9 - 3.3 10e3/uL   MONO# 0.3  0.1 - 0.9 10e3/uL   Eosinophils Absolute 0.1  0.0 - 0.5 10e3/uL   Basophils Absolute 0.0  0.0 - 0.1 10e3/uL   NEUT% 69.1  38.4 - 76.8 %   LYMPH% 17.2  14.0 - 49.7 %   MONO% 9.4  0.0 - 14.0 %   EOS% 4.1  0.0 - 7.0 %   BASO% 0.2  0.0 - 2.0 %  COMPREHENSIVE METABOLIC PANEL (CC13)      Component Value Range   Sodium 139  136 - 145 mEq/L   Potassium 4.1  3.5 - 5.1 mEq/L   Chloride 110 (*) 98 - 107 mEq/L   CO2 21 (*) 22 - 29 mEq/L   Glucose 98  70 - 99 mg/dl   BUN 91.4  7.0 - 78.2 mg/dL   Creatinine 0.8  0.6 - 1.1 mg/dL   Total Bilirubin 9.56  0.20 - 1.20 mg/dL   Alkaline Phosphatase 72  40 - 150 U/L   AST 15  5 - 34 U/L   ALT 9  0 - 55 U/L   Total Protein 6.3 (*) 6.4 - 8.3 g/dL   Albumin 3.6  3.5 - 5.0 g/dL   Calcium 9.4  8.4 - 21.3 mg/dL     Studies/Results: CT AP 11-07-11 was most recent imaging.  Medications: I have reviewed the patient's current medications. We will refill Ativan, as she has only 3 tablets left for upcoming chemotherapy.  Assessment/Plan: 1. IIIC endometrial carcinoma: expect we will need to wait 2-3 weeks post external beam RT to resume chemotherapy, but will wait to see HDR schedule before setting up next chemotherapy and next visits here. Patient understands that we may resume chemotherapy together with HDR depending on timing of those treatments. 2. Peripheral neuropathy symptoms related to taxol not as bothersome now 3. Overdue mammograms, which she will need when this treatment completed 4.family history of colon ca in sibling: needs colonoscopy when this treatment completed.  Patient is comfortable with discussion and plan.    LIVESAY,LENNIS P, MD   12/20/2011, 10:12 AM

## 2011-12-20 NOTE — Progress Notes (Signed)
[facility]    Radiation Oncology [address] [place]     Maryln Gottron, M.D. [place], [place] [address]               Billie Lade, M.D., Ph.D. Phone: [phone]      Molli Hazard A. Kathrynn Running, M.D. Fax: [phone]      Radene Gunning, M.D., Ph.D.         Lurline Hare, M.D.         Grayland Jack, M.D Weekly Treatment Management Note  Name: Tracey Oneill     MRN: [id number]        CSN: [id number] Date: 12/20/2011      DOB: [birth year]  CC: No primary provider on file.         [place]    Status: Outpatient  Diagnosis: The encounter diagnosis was Endometrial carcinoma.  Current Dose: 4140 cGy  Current Fraction: 23/25  Planned Dose: 4500 cGy  Narrative: Fareeha Okula was seen today for weekly treatment management. The chart was checked and MVCT  were reviewed. She continues have fatigue. She works approximately 3 hours a day at this time. She also has some diarrhea which is controlled well with Imodium. She denies any vaginal bleeding or urination difficulties.   Neosporin and Codeine Current Outpatient Prescriptions  Medication Sig Dispense Refill  . loperamide (IMODIUM A-D) 2 MG tablet Take 2 mg by mouth 4 (four) times daily as needed.      Marland Kitchen LORazepam (ATIVAN) 0.5 MG tablet Take 1 tablet under the tongue or swallow every 4-6 hours as needed for nausea. Will make drowsy.  30 tablet  0  . ondansetron (ZOFRAN) 8 MG tablet Take 1-2 tablets (8-16 mg total) by mouth every 12 (twelve) hours as needed for nausea. Will not make you sleepy.  30 tablet  0   Labs:  Lab Results  Component Value Date   WBC 3.5* 12/20/2011   HGB 11.2* 12/20/2011   HCT 31.9* 12/20/2011   MCV 96.2 12/20/2011   PLT 277 12/20/2011   Lab Results  Component Value Date   CREATININE 0.8 12/20/2011   BUN 14.0 12/20/2011   NA 139 12/20/2011   K 4.1 12/20/2011   CL 110* 12/20/2011   CO2 21* 12/20/2011   Lab Results  Component Value Date   ALT 9 12/20/2011   AST 15 12/20/2011   BILITOT 0.50 12/20/2011      Physical Examination:  weight is 116 lb 1.6 oz (52.663 kg). Her oral temperature is 97.7 F (36.5 C). Her blood pressure is 114/54 and her pulse is 73. Her respiration is 20.    Wt Readings from Last 3 Encounters:  12/20/11 116 lb 1.6 oz (52.663 kg)  12/20/11 116 lb 3.2 oz (52.708 kg)  12/13/11 115 lb 9.6 oz (52.436 kg)     Lungs - Normal respiratory effort, chest expands symmetrically. Lungs are clear to auscultation, no crackles or wheezes.  Heart has regular rhythm and rate  Abdomen is soft and non tender with normal bowel sounds  Assessment:  Patient tolerating treatments well  Plan: Continue treatment per original radiation prescription. She has been scheduled for intracavitary brachytherapy treatments on October 3 of October 7 and October 10 which will then be followed by additional chemotherapy.  -----------------------------------  Billie Lade, PhD, MD

## 2011-12-21 ENCOUNTER — Ambulatory Visit
Admission: RE | Admit: 2011-12-21 | Discharge: 2011-12-21 | Disposition: A | Discharge status: Home / Self Care | Payer: BC Managed Care – PPO | Source: Ambulatory Visit | Attending: Radiation Oncology | Admitting: Radiation Oncology

## 2011-12-22 ENCOUNTER — Ambulatory Visit
Admission: RE | Admit: 2011-12-22 | Discharge: 2011-12-22 | Disposition: A | Discharge status: Home / Self Care | Payer: BC Managed Care – PPO | Source: Ambulatory Visit | Attending: Radiation Oncology | Admitting: Radiation Oncology

## 2011-12-22 ENCOUNTER — Encounter: Payer: Self-pay | Admitting: Radiation Oncology

## 2011-12-22 NOTE — Progress Notes (Incomplete)
°  Radiation Oncology         [phone] ________________________________  Name: Tracey Oneill MRN: [id number]  Date: 12/22/2011  DOB: [birth year]  End of Treatment Note  Diagnosis:  Stage IIIc endometrial adenocarcinoma  Indication for treatment:  ***       Radiation treatment dates:   11/17/2011-12/22/2011  Site/dose:   ***  Beams/energy:   ***  Narrative: The patient tolerated radiation treatment relatively well.   ***  Plan: The patient has completed radiation treatment. The patient will return to radiation oncology clinic for routine followup in one month. I advised them to call or return sooner if they have any questions or concerns related to their recovery or treatment.  -----------------------------------  Billie Lade, PhD, MD

## 2011-12-23 ENCOUNTER — Ambulatory Visit: Payer: BC Managed Care – PPO

## 2011-12-26 ENCOUNTER — Ambulatory Visit: Payer: BC Managed Care – PPO

## 2011-12-27 ENCOUNTER — Ambulatory Visit: Payer: BC Managed Care – PPO

## 2011-12-28 ENCOUNTER — Ambulatory Visit: Payer: BC Managed Care – PPO

## 2011-12-28 ENCOUNTER — Telehealth: Payer: Self-pay | Admitting: *Deleted

## 2011-12-28 NOTE — Telephone Encounter (Signed)
CALLED PATIENT TO REMIND OF APPTS. FOR 12-29-11, LVM FOR A RETURN CALL

## 2011-12-29 ENCOUNTER — Encounter: Payer: Self-pay | Admitting: Radiation Oncology

## 2011-12-29 ENCOUNTER — Ambulatory Visit: Payer: BC Managed Care – PPO

## 2011-12-29 ENCOUNTER — Ambulatory Visit
Admission: RE | Admit: 2011-12-29 | Discharge: 2011-12-29 | Disposition: A | Discharge status: Home / Self Care | Payer: BC Managed Care – PPO | Source: Ambulatory Visit | Attending: Radiation Oncology | Admitting: Radiation Oncology

## 2011-12-29 VITALS — BP 94/54 | HR 77 | Temp 97.8°F | Resp 20 | Wt 114.3 lb

## 2011-12-29 DIAGNOSIS — C541 Malignant neoplasm of endometrium: Secondary | ICD-10-CM

## 2011-12-29 HISTORY — DX: Personal history of irradiation: Z92.3

## 2011-12-29 NOTE — Progress Notes (Signed)
   Department of Radiation Oncology  Phone:  [phone] Fax:        [phone]   Simulation note  After completion of the vaginal brachii therapy procedure, the patient was transferred to the CT simulation suite. She then had placement of her vaginal cylinder. This was affixed to the CT/MR stabilization plate to prevent slippage. Patient then underwent imaging through the pelvis region. The vaginal cylinder was in good position. The patient had outlining of the vaginal cylinder as well as the bladder rectum small bowel and rectosigmoid colon. She will proceed with planning for her high-dose-rate treatments. The patient will receive 3 weekly sessions with the dose of 600 centigrade to the musosal surface.  -----------------------------------  Billie Lade, PhD, MD

## 2011-12-29 NOTE — Progress Notes (Signed)
Patient fitted with HDR cylinder 9 9 9 8  8.Patient tolerated well.

## 2011-12-29 NOTE — Addendum Note (Signed)
Encounter addended by: Tessa Lerner, RN on: 12/29/2011  5:09 PM<BR>     Documentation filed: Charges VN

## 2011-12-29 NOTE — Progress Notes (Signed)
   Department of Radiation Oncology  Phone:  [phone] Fax:        [phone]   High-dose-rate brachytherapy procedure note  After planning was complete the patient was transferred to the high dose rate suite. She was placed in the dorsal lithotomy position. The vaginal cylinder was reinserted and affixed to the CT/MR stabilization plate to prevent slippage.   Verification simulation  A fiducial marker was placed within the vaginal cylinder. Patient then proceeded to have an AP and lateral film obtained. These films were compared to the patient's planning films earlier in the day documenting good position of the vaginal cylinder for treatment.  High-dose-rate brachytherapy treatment  The remote afterloading catheter was attached to the vaginal cylinder. Patient proceeded to undergo her first high-dose-rate treatment. She was prescribed a dose of 600 centigrade to the mucosal surface. This was achieved with a total dwell time of 195.3 seconds. The patient was treated with 1 channel using 7 dwell positions. Iridium 192 as the high-dose-rate source. The patient tolerated the procedure well. After completion of her therapy a radiation survey was performed documenting return of the iridium source into the Nucletron safe.  -----------------------------------  Billie Lade, PhD, MD

## 2011-12-29 NOTE — Progress Notes (Signed)
   Department of Radiation Oncology  Phone:  [phone] Fax:        [phone]   Vaginal brachii therapy procedure note  This was taken to the nursing station and placed in the dorsolithotomy position. She proceeded to undergo pelvic examination. There is some erythema to the vaginal cuff area but no evidence of recurrence. A bimanual examination revealed no pelvic masses. Patient then proceeded to undergo dilation of the vaginal introitus. Patient then underwent fitting for her vaginal cylinder. Optimal diameter with a 3.0 cm diameter cylinder.  patient tolerated the procedure well. She will later in the day undergo planning and her first high-dose-rate treatment.  Simple treatment device note  Patient had construction of her custom vaginal cylinder to be used for her high-dose-rate treatments. She will be treated with 3-3.0 cm rings placed proximally within the vagina. More distal will be to 2- 2.5 cm rings.  -----------------------------------  Billie Lade, PhD, MD

## 2011-12-30 ENCOUNTER — Telehealth: Payer: Self-pay | Admitting: Oncology

## 2011-12-30 ENCOUNTER — Other Ambulatory Visit: Payer: Self-pay | Admitting: Oncology

## 2011-12-30 ENCOUNTER — Telehealth: Payer: Self-pay | Admitting: *Deleted

## 2011-12-30 DIAGNOSIS — C541 Malignant neoplasm of endometrium: Secondary | ICD-10-CM

## 2011-12-30 NOTE — Telephone Encounter (Signed)
Called patient to remind of HDR Tx. For 01-02-12 @ 2:00 pm, lvm, for a return call

## 2011-12-30 NOTE — Telephone Encounter (Signed)
called pt and l/m with appts and for her to get a sch on 10/10    aom

## 2012-01-01 ENCOUNTER — Other Ambulatory Visit: Payer: Self-pay | Admitting: Oncology

## 2012-01-02 ENCOUNTER — Ambulatory Visit
Admission: RE | Admit: 2012-01-02 | Discharge: 2012-01-02 | Disposition: A | Discharge status: Home / Self Care | Payer: BC Managed Care – PPO | Source: Ambulatory Visit | Attending: Radiation Oncology | Admitting: Radiation Oncology

## 2012-01-02 DIAGNOSIS — C541 Malignant neoplasm of endometrium: Secondary | ICD-10-CM

## 2012-01-02 NOTE — Progress Notes (Signed)
   Department of Radiation Oncology  Phone:  [phone] Fax:        [phone]   High-dose-rate brachytherapy procedure note  After dose calculation was complete the patient was transferred to the high dose rate suite. She was placed in the dorsal lithotomy position. A pelvic examination revealed no masses. The vaginal cylinder was reinserted and affixed to the CT/MR stabilization plate to prevent slippage.  She tolerated the procedure well.  Simple treatment device note  Patient had construction of her custom vaginal cylinder to be used for her high-dose-rate treatments. She will be treated with 3-3.0 cm rings placed proximally within the vagina. More distal will be to 2- 2.5 cm rings.   Verification simulation  A fiducial marker was placed within the vaginal cylinder. Patient then proceeded to have an AP and lateral film obtained. These films were compared to the patient's planning films earlier  documenting good position of the vaginal cylinder for treatment.  High-dose-rate brachytherapy treatment  The remote afterloading catheter was attached to the vaginal cylinder. Patient proceeded to undergo her second high-dose-rate treatment. She was prescribed a dose of 600 centigrade to the mucosal surface. This was achieved with a total dwell time of 203.0 seconds. The patient was treated with 1 channel using 7 dwell positions. Iridium 192 as the high-dose-rate source. The patient tolerated the procedure well. After completion of her therapy a radiation survey was performed documenting return of the iridium source into the Nucletron safe.  -----------------------------------  Billie Lade, PhD, MD

## 2012-01-04 ENCOUNTER — Telehealth: Payer: Self-pay | Admitting: *Deleted

## 2012-01-04 NOTE — Telephone Encounter (Signed)
Called patient to remind of HDR TX. For 01-05-12, lvm for a return call

## 2012-01-05 ENCOUNTER — Encounter: Payer: Self-pay | Admitting: Radiation Oncology

## 2012-01-05 ENCOUNTER — Ambulatory Visit
Admission: RE | Admit: 2012-01-05 | Discharge: 2012-01-05 | Disposition: A | Discharge status: Home / Self Care | Payer: BC Managed Care – PPO | Source: Ambulatory Visit | Attending: Radiation Oncology | Admitting: Radiation Oncology

## 2012-01-05 ENCOUNTER — Other Ambulatory Visit (HOSPITAL_BASED_OUTPATIENT_CLINIC_OR_DEPARTMENT_OTHER): Payer: BC Managed Care – PPO | Admitting: Lab

## 2012-01-05 DIAGNOSIS — C541 Malignant neoplasm of endometrium: Secondary | ICD-10-CM

## 2012-01-05 DIAGNOSIS — C549 Malignant neoplasm of corpus uteri, unspecified: Secondary | ICD-10-CM

## 2012-01-05 LAB — CBC WITH DIFFERENTIAL/PLATELET
Basophils Absolute: 0 10*3/uL (ref 0.0–0.1)
EOS%: 1.6 % (ref 0.0–7.0)
HGB: 10.8 g/dL — ABNORMAL LOW (ref 11.6–15.9)
LYMPH%: 18 % (ref 14.0–49.7)
MCH: 33.7 pg (ref 25.1–34.0)
MCV: 97.1 fL (ref 79.5–101.0)
MONO%: 9 % (ref 0.0–14.0)
RDW: 13.4 % (ref 11.2–14.5)

## 2012-01-05 LAB — COMPREHENSIVE METABOLIC PANEL (CC13)
AST: 16 U/L (ref 5–34)
Albumin: 3.7 g/dL (ref 3.5–5.0)
Alkaline Phosphatase: 80 U/L (ref 40–150)
BUN: 17 mg/dL (ref 7.0–26.0)
Creatinine: 0.9 mg/dL (ref 0.6–1.1)
Potassium: 3.7 mEq/L (ref 3.5–5.1)
Total Bilirubin: 0.3 mg/dL (ref 0.20–1.20)

## 2012-01-05 NOTE — Progress Notes (Signed)
   Department of Radiation Oncology  Phone:  [phone] Fax:        [phone]   High-dose-rate brachytherapy procedure note  After dose calculation was complete the patient was transferred to the high dose rate suite. She was placed in the dorsal lithotomy position. A pelvic examination revealed no masses. The vaginal cylinder was reinserted and affixed to the CT/MR stabilization plate to prevent slippage.  She tolerated the procedure well.  Simple treatment device note  Patient had construction of her custom vaginal cylinder to be used for her high-dose-rate treatments. She will be treated with 3-3.0 cm rings placed proximally within the vagina. More distal will be to 2- 2.5 cm rings.   Verification simulation  A fiducial marker was placed within the vaginal cylinder. Patient then proceeded to have an AP and lateral film obtained. These films were compared to the patient's planning films earlier  documenting good position of the vaginal cylinder for treatment.  High-dose-rate brachytherapy treatment  The remote afterloading catheter was attached to the vaginal cylinder. Patient proceeded to undergo her third high-dose-rate treatment. She was prescribed a dose of 600 centigrade to the mucosal surface. This was achieved with a total dwell time of 208.7 seconds. The patient was treated with 1 channel using 7 dwell positions. Iridium 192 as the high-dose-rate source. The patient tolerated the procedure well. After completion of her therapy a radiation survey was performed documenting return of the iridium source into the Nucletron safe.  -----------------------------------  Billie Lade, PhD, MD

## 2012-01-08 ENCOUNTER — Encounter: Payer: Self-pay | Admitting: Radiation Oncology

## 2012-01-08 NOTE — Progress Notes (Signed)
  Radiation Oncology         [phone] ________________________________  Name: Tracey Oneill MRN: [id number]  Date: 01/08/2012  DOB: 1950-08-05  End of Treatment Note  Diagnosis:   Stage III-C endometrial adenocarcinoma     Indication for treatment:  Postop with risk for pelvic recurrence       Radiation treatment dates:   External beam August 22 through December 22, 2011                                                  Intracavitary brachytherapy treatments, October 3 of October 7 of January 05, 2012  Site/dose:   Whole pelvis 4500 cGy in 25 fractions,  the vaginal cuff area received a cumulative brachytherapy dose of 1800 cGy  Beams/energy:   The pelvis was treated with intensity modulated radiation therapy using helical approach on the tomotherapy unit.   6 MV photons were used to deliver the patient's treatment. Intracavitary brachii therapy treatments were with iridium 192 using a 3 cm diameter cylinder with a prescription of 600 cGy to the proximal vaginal mucosa. A  3.0 cm treatment length was used to deliver the patient's therapy.  Narrative: The patient tolerated radiation treatment relatively well.   She did have some diarrhea with her external beam treatments. This was controlled reasonably well with Imodium/Lomotil.  She in addition had fatigue which necessitated cutting back on her work hours.  Plan: The patient has completed radiation treatment. The patient will return to radiation oncology clinic for routine followup in one month. I advised them to call or return sooner if they have any questions or concerns related to their recovery or treatment.  -----------------------------------  Billie Lade, PhD, MD

## 2012-01-08 NOTE — Progress Notes (Signed)
   Department of Radiation Oncology  Phone:  [phone] Fax:        [phone]   Intensity modulated radiation therapy simulation note  Diagnosis: stage IIIc endometrial cancer  On 11/15/2011 the patient had completion of her intensity modulated radiation therapy plan. Intensity modulated radiation therapy was chosen over conventional or conformal treatment to more accurately target the area of concern and to  limit dose to normal surrounding critical structures i.e. the small bowel. Dose volume histograms of the target structures as well as critical structures are reviewed acccepted and placed in the patient's chart. The patient will receive 4500 cGy in 25 fractions.  -----------------------------------  Billie Lade, PhD, MD

## 2012-01-10 ENCOUNTER — Encounter: Payer: Self-pay | Admitting: Radiation Oncology

## 2012-01-13 ENCOUNTER — Other Ambulatory Visit (HOSPITAL_BASED_OUTPATIENT_CLINIC_OR_DEPARTMENT_OTHER): Payer: BC Managed Care – PPO | Admitting: Lab

## 2012-01-13 ENCOUNTER — Ambulatory Visit (HOSPITAL_BASED_OUTPATIENT_CLINIC_OR_DEPARTMENT_OTHER): Payer: BC Managed Care – PPO

## 2012-01-13 VITALS — BP 109/52 | HR 93 | Temp 97.8°F | Resp 18

## 2012-01-13 DIAGNOSIS — C541 Malignant neoplasm of endometrium: Secondary | ICD-10-CM

## 2012-01-13 DIAGNOSIS — C549 Malignant neoplasm of corpus uteri, unspecified: Secondary | ICD-10-CM

## 2012-01-13 DIAGNOSIS — Z5111 Encounter for antineoplastic chemotherapy: Secondary | ICD-10-CM

## 2012-01-13 LAB — CBC WITH DIFFERENTIAL/PLATELET
Basophils Absolute: 0 10*3/uL (ref 0.0–0.1)
EOS%: 0 % (ref 0.0–7.0)
Eosinophils Absolute: 0 10*3/uL (ref 0.0–0.5)
HCT: 33.3 % — ABNORMAL LOW (ref 34.8–46.6)
HGB: 11.4 g/dL — ABNORMAL LOW (ref 11.6–15.9)
LYMPH%: 8.3 % — ABNORMAL LOW (ref 14.0–49.7)
MCH: 32.2 pg (ref 25.1–34.0)
MCV: 94.1 fL (ref 79.5–101.0)
MONO%: 0.7 % (ref 0.0–14.0)
NEUT%: 90.7 % — ABNORMAL HIGH (ref 38.4–76.8)
Platelets: 242 10*3/uL (ref 145–400)

## 2012-01-13 MED ORDER — CARBOPLATIN CHEMO INJECTION 600 MG/60ML
439.0000 mg | Freq: Once | INTRAVENOUS | Status: AC
  Administered 2012-01-13: 440 mg via INTRAVENOUS
  Filled 2012-01-13: qty 44

## 2012-01-13 MED ORDER — FAMOTIDINE IN NACL 20-0.9 MG/50ML-% IV SOLN
20.0000 mg | Freq: Once | INTRAVENOUS | Status: AC
  Administered 2012-01-13: 20 mg via INTRAVENOUS

## 2012-01-13 MED ORDER — DIPHENHYDRAMINE HCL 50 MG/ML IJ SOLN
50.0000 mg | Freq: Once | INTRAMUSCULAR | Status: AC
  Administered 2012-01-13: 50 mg via INTRAVENOUS

## 2012-01-13 MED ORDER — DEXAMETHASONE SODIUM PHOSPHATE 4 MG/ML IJ SOLN
20.0000 mg | Freq: Once | INTRAMUSCULAR | Status: AC
  Administered 2012-01-13: 20 mg via INTRAVENOUS

## 2012-01-13 MED ORDER — SODIUM CHLORIDE 0.9 % IV SOLN
Freq: Once | INTRAVENOUS | Status: AC
  Administered 2012-01-13: 11:00:00 via INTRAVENOUS

## 2012-01-13 MED ORDER — ONDANSETRON 16 MG/50ML IVPB (CHCC)
16.0000 mg | Freq: Once | INTRAVENOUS | Status: AC
  Administered 2012-01-13: 16 mg via INTRAVENOUS

## 2012-01-13 MED ORDER — PACLITAXEL CHEMO INJECTION 300 MG/50ML
175.0000 mg/m2 | Freq: Once | INTRAVENOUS | Status: AC
  Administered 2012-01-13: 276 mg via INTRAVENOUS
  Filled 2012-01-13: qty 46

## 2012-01-13 NOTE — Patient Instructions (Signed)
[facility] Discharge Instructions for Patients Receiving Chemotherapy  Today you received the following chemotherapy agents Taxol/Carboplatin To help prevent nausea and vomiting after your treatment, we encourage you to take your nausea medication as prescribed.  If you develop nausea and vomiting that is not controlled by your nausea medication, call the clinic. If it is after clinic hours your family physician or the after hours number for the clinic or go to the Emergency Department.   BELOW ARE SYMPTOMS THAT SHOULD BE REPORTED IMMEDIATELY:  *FEVER GREATER THAN 100.5 F  *CHILLS WITH OR WITHOUT FEVER  NAUSEA AND VOMITING THAT IS NOT CONTROLLED WITH YOUR NAUSEA MEDICATION  *UNUSUAL SHORTNESS OF BREATH  *UNUSUAL BRUISING OR BLEEDING  TENDERNESS IN MOUTH AND THROAT WITH OR WITHOUT PRESENCE OF ULCERS  *URINARY PROBLEMS  *BOWEL PROBLEMS  UNUSUAL RASH Items with * indicate a potential emergency and should be followed up as soon as possible.  One of the nurses will contact you 24 hours after your treatment. Please let the nurse know about any problems that you may have experienced. Feel free to call the clinic you have any questions or concerns. The clinic phone number is [phone].   I have been informed and understand all the instructions given to me. I know to contact the clinic, my physician, or go to the Emergency Department if any problems should occur. I do not have any questions at this time, but understand that I may call the clinic during office hours   should I have any questions or need assistance in obtaining follow up care.    __________________________________________  _____________  __________ Signature of Patient or Authorized Representative            Date                   Time    __________________________________________ Nurse's Signature    

## 2012-01-14 ENCOUNTER — Ambulatory Visit (HOSPITAL_BASED_OUTPATIENT_CLINIC_OR_DEPARTMENT_OTHER): Payer: BC Managed Care – PPO

## 2012-01-14 VITALS — BP 131/54 | HR 103 | Temp 97.2°F

## 2012-01-14 DIAGNOSIS — Z5189 Encounter for other specified aftercare: Secondary | ICD-10-CM

## 2012-01-14 DIAGNOSIS — C549 Malignant neoplasm of corpus uteri, unspecified: Secondary | ICD-10-CM

## 2012-01-14 DIAGNOSIS — C541 Malignant neoplasm of endometrium: Secondary | ICD-10-CM

## 2012-01-14 MED ORDER — PEGFILGRASTIM INJECTION 6 MG/0.6ML
6.0000 mg | Freq: Once | SUBCUTANEOUS | Status: AC
  Administered 2012-01-14: 6 mg via SUBCUTANEOUS

## 2012-01-16 ENCOUNTER — Telehealth: Payer: Self-pay

## 2012-01-16 NOTE — Telephone Encounter (Signed)
Tracey Oneill wondered if she received too much benadryl Friday with her treatment.  She was sleepy in the infusion room and still too tired to do anything.  Told her that she  received the same amount of benadryl (50 mg) iv as in previous treatments.  Told her that there is a cumulative affect with the chemotherapy.  She aslo is still recovering from the radiation effects.  It is expected to feel like a mack truck hit you about the third day after treatment.  It may take a little longer for the symptoms to improve.  Some walking/light exercise can help with the fatigue. [name] verbalized understanding.

## 2012-01-19 ENCOUNTER — Telehealth: Payer: Self-pay | Admitting: *Deleted

## 2012-01-19 NOTE — Telephone Encounter (Signed)
Pt called to say she has been having burning in her stomach since last chemo treatment 01/13/12. Is able to eat, but has the burning with each meal. Has not taken any OTC meds. I spoke with Tiana Loft, PA and she suggested Tums, zantac or pepcid. Told pt to try one of those this evening and tomorrow am. To call us back if no relief.

## 2012-01-27 ENCOUNTER — Other Ambulatory Visit (HOSPITAL_BASED_OUTPATIENT_CLINIC_OR_DEPARTMENT_OTHER): Payer: BC Managed Care – PPO | Admitting: Lab

## 2012-01-27 ENCOUNTER — Telehealth: Payer: Self-pay | Admitting: Oncology

## 2012-01-27 ENCOUNTER — Ambulatory Visit (HOSPITAL_BASED_OUTPATIENT_CLINIC_OR_DEPARTMENT_OTHER): Payer: BC Managed Care – PPO | Admitting: Oncology

## 2012-01-27 ENCOUNTER — Encounter: Payer: Self-pay | Admitting: Oncology

## 2012-01-27 VITALS — BP 111/60 | HR 96 | Temp 97.4°F | Resp 20 | Ht 63.5 in | Wt 114.6 lb

## 2012-01-27 DIAGNOSIS — C541 Malignant neoplasm of endometrium: Secondary | ICD-10-CM

## 2012-01-27 DIAGNOSIS — C549 Malignant neoplasm of corpus uteri, unspecified: Secondary | ICD-10-CM

## 2012-01-27 DIAGNOSIS — Z23 Encounter for immunization: Secondary | ICD-10-CM

## 2012-01-27 LAB — CBC WITH DIFFERENTIAL/PLATELET
Basophils Absolute: 0 10*3/uL (ref 0.0–0.1)
EOS%: 0.5 % (ref 0.0–7.0)
Eosinophils Absolute: 0 10*3/uL (ref 0.0–0.5)
LYMPH%: 14.1 % (ref 14.0–49.7)
MCH: 33.7 pg (ref 25.1–34.0)
MCV: 96.8 fL (ref 79.5–101.0)
MONO%: 7.3 % (ref 0.0–14.0)
Platelets: 153 10*3/uL (ref 145–400)
RBC: 3.02 10*6/uL — ABNORMAL LOW (ref 3.70–5.45)
RDW: 12.3 % (ref 11.2–14.5)

## 2012-01-27 LAB — COMPREHENSIVE METABOLIC PANEL (CC13)
AST: 22 U/L (ref 5–34)
Albumin: 3.5 g/dL (ref 3.5–5.0)
Alkaline Phosphatase: 98 U/L (ref 40–150)
BUN: 13 mg/dL (ref 7.0–26.0)
Glucose: 113 mg/dl — ABNORMAL HIGH (ref 70–99)
Potassium: 3.8 mEq/L (ref 3.5–5.1)
Sodium: 141 mEq/L (ref 136–145)
Total Bilirubin: 0.29 mg/dL (ref 0.20–1.20)

## 2012-01-27 MED ORDER — INFLUENZA VIRUS VACC SPLIT PF IM SUSP
0.5000 mL | Freq: Once | INTRAMUSCULAR | Status: AC
  Administered 2012-01-27: 0.5 mL via INTRAMUSCULAR
  Filled 2012-01-27: qty 0.5

## 2012-01-27 NOTE — Patient Instructions (Signed)
Flu vaccine today  We will check blood counts ~ day before chemo next week, to be sure ok to take steroids for treatment 02-03-12. Nurse will let you know about the counts.

## 2012-01-27 NOTE — Telephone Encounter (Signed)
gv pt appt schedule for November.  °

## 2012-01-27 NOTE — Progress Notes (Signed)
OFFICE PROGRESS NOTE   01/27/2012   Physicians:W.Brewster, J.Fernandez, C.McAlhany, J.Kinard   INTERVAL HISTORY:  Patient is seen, alone for visit, in continuing attention to her IIIC endometrial carcinoma, having had cycle 4 taxol/carboplatin on 01-13-12.  Patient had new onset of postmenopausal bleeding in March 2013, seen promptly by Dr Reynaldo Minium. Pathology of endometrial biopsy ([id number], [facility]) showed adenocarcinoma with immunohistochemistry favoring endometrial, FIGO grade II. She had CT AP at [facility] on 06-10-11, and  CXR. She had preoperative cardiology consult by Dr.Christopher McAlhany. She was referred to Dr Festus Holts in gyn oncology at [facility], with pathology reviewed and confirmed. She had total robotic hysterectomy, BSO and bilateral pelvic lymph node dissection at [facility] 07-21-2011, tolerated well. Pathology [phone]) showed endometroid adenocarcinoma invading outer half of myometrium 2 cm where wall thickness 2.2 cm, FIGO grade 2, no LVSI identified. One of 6 right pelvic nodes and 0 of 7 left pelvic nodes were involved, cervix and bilateral tubes and ovaries without malignancy. Patient's postoperative course was been unremarkable. She was seen back by Dr Tresa Endo for postoperative visit on 08-08-2011, apparently recommendation for treatment on GOG 258, which she declined at my new patient consultation. She requested further care in [place] as this is most convenient for her. She did generally well with first 3 cycles of chemotherapy, did require neulasta.She had external beam radiation thru late Sept 2013 and HDR x3 thru 01-05-12. Plan is total of 6 cycles of chemotherapy, with cycle 5 due 02-03-12.  Patient was generally fatigued particularly first week after chemotherapy, better this week. She has no peripheral neuropathy symptoms, no nausea, bowels moving without difficulty, appetite now good, no fever or symptoms of infection, no abdominal or pelvic  discomfort. She has been able to sleep. Remainder of 10 point Review of Systems negative.  Objective:  Vital signs in last 24 hours:  BP 111/60  Pulse 96  Temp 97.4 F (36.3 C) (Oral)  Resp 20  Ht 5' 3.5" (1.613 m)  Wt 114 lb 9.6 oz (51.982 kg)  BMI 19.98 kg/m2 Weight is down 2 lbs. No complete alopecia now, as hair regrew during RT. Easily mobile, looks comfortable.  HEENT:PERRLA, sclera clear, anicteric and oropharynx clear, no lesions LymphaticsCervical, supraclavicular, and axillary nodes normal. No inguinal adenopathy Resp: clear to auscultation bilaterally and normal percussion bilaterally Cardio: regular rate and rhythm GI: soft, non-tender; bowel sounds normal; no masses,  no organomegaly Not distended, surgical incision well healed. Extremities: extremities normal, atraumatic, no cyanosis or edema Neuro:no sensory deficits noted Breast:normal without suspicious masses, skin or nipple changes or axillary nodes No  Central line Lab Results:  Results for orders placed in visit on 01/27/12  CBC WITH DIFFERENTIAL      Component Value Range   WBC 6.6  3.9 - 10.3 10e3/uL   NEUT# 5.1  1.5 - 6.5 10e3/uL   HGB 10.2 (*) 11.6 - 15.9 g/dL   HCT 87.5 (*) 64.3 - 32.9 %   Platelets 153  145 - 400 10e3/uL   MCV 96.8  79.5 - 101.0 fL   MCH 33.7  25.1 - 34.0 pg   MCHC 34.8  31.5 - 36.0 g/dL   RBC 5.18 (*) 8.41 - 6.60 10e6/uL   RDW 12.3  11.2 - 14.5 %   lymph# 0.9  0.9 - 3.3 10e3/uL   MONO# 0.5  0.1 - 0.9 10e3/uL   Eosinophils Absolute 0.0  0.0 - 0.5 10e3/uL   Basophils Absolute 0.0  0.0 - 0.1 10e3/uL  NEUT% 78.0 (*) 38.4 - 76.8 %   LYMPH% 14.1  14.0 - 49.7 %   MONO% 7.3  0.0 - 14.0 %   EOS% 0.5  0.0 - 7.0 %   BASO% 0.1  0.0 - 2.0 %  COMPREHENSIVE METABOLIC PANEL (CC13)      Component Value Range   Sodium 141  136 - 145 mEq/L   Potassium 3.8  3.5 - 5.1 mEq/L   Chloride 111 (*) 98 - 107 mEq/L   CO2 24  22 - 29 mEq/L   Glucose 113 (*) 70 - 99 mg/dl   BUN 16.1  7.0 - 09.6  mg/dL   Creatinine 0.8  0.6 - 1.1 mg/dL   Total Bilirubin 0.45  0.20 - 1.20 mg/dL   Alkaline Phosphatase 98  40 - 150 U/L   AST 22  5 - 34 U/L   ALT 27  0 - 55 U/L   Total Protein 6.0 (*) 6.4 - 8.3 g/dL   Albumin 3.5  3.5 - 5.0 g/dL   Calcium 9.3  8.4 - 40.9 mg/dL     Studies/Results:  No results found.  Medications: I have reviewed the patient's current medications. She received flu vaccine today.  Assessment/Plan: 1. IIIC endometrial carcinoma: history as above, continuing adjuvant taxol/carboplatin chemotherapy.  She will have cycle 5 on 02-03-12 as long as ANC >=1.5 and plt >=100k by CBC 02-02-12. She will have neulasta day after rx. I will see her again a few days prior to cycle 6, due 02-24-12. 2.flu vaccine given today  Patient is in agreement with plan above.   LIVESAY,LENNIS P, MD   01/27/2012, 11:10 AM

## 2012-01-30 ENCOUNTER — Telehealth: Payer: Self-pay

## 2012-01-30 NOTE — Telephone Encounter (Signed)
Tracey Oneill stated that her filling came out of her left back molar ~3 weeks ago.  It has been uncomfortable on and off since then.  Last night she has a horrible throbbing tooth ache. Told her that Dr. Darrold Span said that she needs to locate a dentist as she does not have one at present and have the tooth evaluated prior to treatment 02-03-12.  Treatment may have to be delayed pending what needs to be done to the tooth. Dr. Kristin Bruins in dental clinic does not handle this type of emergency.  Pt. Verbalized understanding.  Requested that she keep Dr. Darrold Span informed of the situation.

## 2012-01-31 NOTE — Telephone Encounter (Signed)
Tracey Oneill called and stated that her tooth needed a root canal.  There was no infection noted.  She told them just to pull the tooth.  It was extracted 01-30-12.  The throbbing  pain has resolved. There is some discomfort  From the  Surgical procedure.  She is using ibuprofen with good results.  Dr. Darrold Span said that she is fine for her treatment on 02-02-12 (pending lab 02-02-12).  Tracey Oneill is to inform the infusion nurse if she has any swelling in the left lower molar site of extraxction.

## 2012-02-02 ENCOUNTER — Other Ambulatory Visit (HOSPITAL_BASED_OUTPATIENT_CLINIC_OR_DEPARTMENT_OTHER): Payer: BC Managed Care – PPO

## 2012-02-02 ENCOUNTER — Telehealth: Payer: Self-pay | Admitting: *Deleted

## 2012-02-02 DIAGNOSIS — C549 Malignant neoplasm of corpus uteri, unspecified: Secondary | ICD-10-CM

## 2012-02-02 DIAGNOSIS — C541 Malignant neoplasm of endometrium: Secondary | ICD-10-CM

## 2012-02-02 LAB — CBC WITH DIFFERENTIAL/PLATELET
BASO%: 0.2 % (ref 0.0–2.0)
EOS%: 0.4 % (ref 0.0–7.0)
HCT: 28.6 % — ABNORMAL LOW (ref 34.8–46.6)
LYMPH%: 22.2 % (ref 14.0–49.7)
MCH: 33.3 pg (ref 25.1–34.0)
MCHC: 34.4 g/dL (ref 31.5–36.0)
MCV: 96.8 fL (ref 79.5–101.0)
MONO%: 10.6 % (ref 0.0–14.0)
NEUT%: 66.6 % (ref 38.4–76.8)
lymph#: 1.2 10*3/uL (ref 0.9–3.3)

## 2012-02-02 NOTE — Telephone Encounter (Signed)
Left message for patient that she is OK to take chemo tomorrow based on CBC from today. Told pt to call if she has further concerns.

## 2012-02-03 ENCOUNTER — Ambulatory Visit (HOSPITAL_BASED_OUTPATIENT_CLINIC_OR_DEPARTMENT_OTHER): Payer: BC Managed Care – PPO

## 2012-02-03 VITALS — BP 130/59 | HR 93 | Temp 98.3°F | Resp 18

## 2012-02-03 DIAGNOSIS — Z5111 Encounter for antineoplastic chemotherapy: Secondary | ICD-10-CM

## 2012-02-03 DIAGNOSIS — C541 Malignant neoplasm of endometrium: Secondary | ICD-10-CM

## 2012-02-03 DIAGNOSIS — C549 Malignant neoplasm of corpus uteri, unspecified: Secondary | ICD-10-CM

## 2012-02-03 MED ORDER — DIPHENHYDRAMINE HCL 50 MG/ML IJ SOLN
50.0000 mg | Freq: Once | INTRAMUSCULAR | Status: AC
  Administered 2012-02-03: 50 mg via INTRAVENOUS

## 2012-02-03 MED ORDER — FAMOTIDINE IN NACL 20-0.9 MG/50ML-% IV SOLN
20.0000 mg | Freq: Once | INTRAVENOUS | Status: AC
  Administered 2012-02-03: 20 mg via INTRAVENOUS

## 2012-02-03 MED ORDER — SODIUM CHLORIDE 0.9 % IV SOLN
Freq: Once | INTRAVENOUS | Status: AC
  Administered 2012-02-03: 12:00:00 via INTRAVENOUS

## 2012-02-03 MED ORDER — DEXAMETHASONE SODIUM PHOSPHATE 4 MG/ML IJ SOLN
20.0000 mg | Freq: Once | INTRAMUSCULAR | Status: AC
  Administered 2012-02-03: 20 mg via INTRAVENOUS

## 2012-02-03 MED ORDER — SODIUM CHLORIDE 0.9 % IV SOLN
424.5000 mg | Freq: Once | INTRAVENOUS | Status: AC
  Administered 2012-02-03: 420 mg via INTRAVENOUS
  Filled 2012-02-03: qty 42

## 2012-02-03 MED ORDER — PACLITAXEL CHEMO INJECTION 300 MG/50ML
175.0000 mg/m2 | Freq: Once | INTRAVENOUS | Status: AC
  Administered 2012-02-03: 270 mg via INTRAVENOUS
  Filled 2012-02-03: qty 45

## 2012-02-03 MED ORDER — ONDANSETRON 16 MG/50ML IVPB (CHCC)
16.0000 mg | Freq: Once | INTRAVENOUS | Status: AC
Start: 2012-02-03 — End: 2012-02-03
  Administered 2012-02-03: 16 mg via INTRAVENOUS

## 2012-02-03 NOTE — Patient Instructions (Signed)
[facility] Discharge Instructions for Patients Receiving Chemotherapy  Today you received the following chemotherapy agents Taxol and Carboplatin.  To help prevent nausea and vomiting after your treatment, we encourage you to take your nausea medication. Begin taking your nausea medication as often as prescribed for by Dr Livesay.    If you develop nausea and vomiting that is not controlled by your nausea medication, call the clinic. If it is after clinic hours your family physician or the after hours number for the clinic or go to the Emergency Department.   BELOW ARE SYMPTOMS THAT SHOULD BE REPORTED IMMEDIATELY:  *FEVER GREATER THAN 100.5 F  *CHILLS WITH OR WITHOUT FEVER  NAUSEA AND VOMITING THAT IS NOT CONTROLLED WITH YOUR NAUSEA MEDICATION  *UNUSUAL SHORTNESS OF BREATH  *UNUSUAL BRUISING OR BLEEDING  TENDERNESS IN MOUTH AND THROAT WITH OR WITHOUT PRESENCE OF ULCERS  *URINARY PROBLEMS  *BOWEL PROBLEMS  UNUSUAL RASH Items with * indicate a potential emergency and should be followed up as soon as possible.  One of the nurses will contact you 24 hours after your treatment. Please let the nurse know about any problems that you may have experienced. Feel free to call the clinic you have any questions or concerns. The clinic phone number is [phone].   I have been informed and understand all the instructions given to me. I know to contact the clinic, my physician, or go to the Emergency Department if any problems should occur. I do not have any questions at this time, but understand that I may call the clinic during office hours   should I have any questions or need assistance in obtaining follow up care.    __________________________________________  _____________  __________ Signature of Patient or Authorized Representative            Date                   Time    __________________________________________ Nurse's Signature    

## 2012-02-04 ENCOUNTER — Ambulatory Visit (HOSPITAL_BASED_OUTPATIENT_CLINIC_OR_DEPARTMENT_OTHER): Payer: BC Managed Care – PPO

## 2012-02-04 VITALS — BP 133/76 | HR 89 | Temp 98.0°F

## 2012-02-04 DIAGNOSIS — C541 Malignant neoplasm of endometrium: Secondary | ICD-10-CM

## 2012-02-04 DIAGNOSIS — C549 Malignant neoplasm of corpus uteri, unspecified: Secondary | ICD-10-CM

## 2012-02-04 DIAGNOSIS — Z5189 Encounter for other specified aftercare: Secondary | ICD-10-CM

## 2012-02-04 MED ORDER — PEGFILGRASTIM INJECTION 6 MG/0.6ML
6.0000 mg | Freq: Once | SUBCUTANEOUS | Status: AC
  Administered 2012-02-04: 6 mg via SUBCUTANEOUS

## 2012-02-06 ENCOUNTER — Telehealth: Payer: Self-pay

## 2012-02-06 DIAGNOSIS — C541 Malignant neoplasm of endometrium: Secondary | ICD-10-CM

## 2012-02-06 NOTE — Telephone Encounter (Signed)
Left a message for Tracey Oneill that Dr. Darrold Span wants to follow up her CBC from 02-02-12 when she comes to see Dr. Roselind Messier as noted below.  Left appt. Time of 0830 in message for 02-09-12 as her appt. With Dr. Roselind Messier is at 0900.  Will call her with the results.

## 2012-02-06 NOTE — Telephone Encounter (Signed)
Message copied by Lorine Bears on Mon Feb 06, 2012  1:27 PM ------      Message from: Reece Packer      Created: Sun Feb 05, 2012  3:22 PM       Labs seen and need follow up: hgb 9.8 prior to chemo 11-8. Suggest repeat CBC on 02-09-12 when she is at [facility] to see Dr Roselind Messier. Orders and POF will need to be entered after RN speaks with patient. thanks

## 2012-02-07 ENCOUNTER — Telehealth: Payer: Self-pay | Admitting: *Deleted

## 2012-02-07 NOTE — Telephone Encounter (Signed)
Pt called to follow up on voice mail from yesterday regarding Hgb. Wanted to know what it means that her Hgb is low. Discussed purpose of Hgb and let her know we will follow up when we get the results. Verbalized understanding

## 2012-02-08 ENCOUNTER — Encounter: Payer: Self-pay | Admitting: Radiation Oncology

## 2012-02-09 ENCOUNTER — Ambulatory Visit
Admission: RE | Admit: 2012-02-09 | Discharge: 2012-02-09 | Disposition: A | Discharge status: Home / Self Care | Payer: BC Managed Care – PPO | Source: Ambulatory Visit | Attending: Radiation Oncology | Admitting: Radiation Oncology

## 2012-02-09 ENCOUNTER — Encounter: Payer: Self-pay | Admitting: Radiation Oncology

## 2012-02-09 ENCOUNTER — Other Ambulatory Visit (HOSPITAL_BASED_OUTPATIENT_CLINIC_OR_DEPARTMENT_OTHER): Payer: BC Managed Care – PPO | Admitting: Lab

## 2012-02-09 VITALS — BP 133/82 | HR 112 | Temp 97.7°F | Wt 112.4 lb

## 2012-02-09 DIAGNOSIS — C541 Malignant neoplasm of endometrium: Secondary | ICD-10-CM

## 2012-02-09 DIAGNOSIS — C549 Malignant neoplasm of corpus uteri, unspecified: Secondary | ICD-10-CM

## 2012-02-09 LAB — CBC WITH DIFFERENTIAL/PLATELET
BASO%: 0.3 % (ref 0.0–2.0)
EOS%: 1.4 % (ref 0.0–7.0)
HGB: 10.6 g/dL — ABNORMAL LOW (ref 11.6–15.9)
MCH: 31.4 pg (ref 25.1–34.0)
MCHC: 33.3 g/dL (ref 31.5–36.0)
RBC: 3.38 10*6/uL — ABNORMAL LOW (ref 3.70–5.45)
RDW: 12.7 % (ref 11.2–14.5)
lymph#: 0.8 10*3/uL — ABNORMAL LOW (ref 0.9–3.3)

## 2012-02-09 NOTE — Progress Notes (Signed)
Patient here for routine one month follow up completion of intracavitary radiation on January 05, 2012.Denies pain.No urinary symptoms of burning or frequency of urination.Bowels normal.One more chemotherapy treatment scheduled after Thanksgiving.

## 2012-02-09 NOTE — Progress Notes (Signed)
  Radiation Oncology         [phone] ________________________________  Name: Tracey Oneill MRN: [id number]  Date: 02/09/2012  DOB: [birth year]  Follow-Up Visit Note  CC: No primary provider on file.  No ref. provider found  Diagnosis:   Stage IIIC endometrial adenocarcinoma  Interval Since Last Radiation:  1 months  Narrative:  The patient returns today for routine follow-up.  She seems to be doing well at this time. She has started back on additional chemotherapy. Patient is working her usual schedule except for the past few days while she has been undergoing chemotherapy.  She denies any hematuria urination difficulties or vaginal bleeding. She denies any pelvic pain or rectal bleeding.  Her energy level has been good.                              ALLERGIES:  is allergic to neosporin and codeine.  Meds: Current Outpatient Prescriptions  Medication Sig Dispense Refill  . dexamethasone (DECADRON) 4 MG tablet Take 5 tabs with food 12 and 6 hours prior to taxol chemotherapy.  30 tablet  0  . loperamide (IMODIUM A-D) 2 MG tablet Take 2 mg by mouth 4 (four) times daily as needed.      Marland Kitchen LORazepam (ATIVAN) 0.5 MG tablet Take 1 tablet under the tongue or swallow every 4-6 hours as needed for nausea. Will make drowsy.  30 tablet  0  . ondansetron (ZOFRAN) 8 MG tablet Take 1-2 tablets (8-16 mg total) by mouth every 12 (twelve) hours as needed for nausea. Will not make you sleepy.  30 tablet  0    Physical Findings: The patient is in no acute distress. Patient is alert and oriented.  weight is 112 lb 6.4 oz (50.984 kg). Her temperature is 97.7 F (36.5 C). Her blood pressure is 133/82 and her pulse is 112. Marland Kitchen  No palpable cervical or supraclavicular adenopathy. The lungs are clear to auscultation. The heart has a regular rhythm and rate. The abdomen is soft and nontender with normal bowel sounds. A pelvic exam is not performed in light of patient's recent completion of treatment.  Lab  Findings: Lab Results  Component Value Date   WBC 3.5* 02/09/2012   HGB 10.6* 02/09/2012   HCT 31.8* 02/09/2012   MCV 94.1 02/09/2012   PLT 114* 02/09/2012    @LASTCHEM @  Radiographic Findings: No results found.  Impression:  The patient is recovering from the effects of radiation.   Plan:  Routine followup in April of 2014. The patient will continue with 2 additional cycles of chemotherapy to complete her adjuvant treatment.  Patient will be seen in gynecologic oncology in approximately 2 months for pelvic exam and Pap smear.  She was given a vaginal dilator and instructions on its use today.  _____________________________________    Billie Lade, PhD, MD

## 2012-02-10 ENCOUNTER — Telehealth: Payer: Self-pay

## 2012-02-10 NOTE — Telephone Encounter (Signed)
Told Tracey Oneill that her hgb was better at 10.6,wbw, and plt. count good on labs 02-09-12 per Dr. Darrold Span.  Tracey Oneill thought her counts were better.  She states that she is feeling better.

## 2012-02-17 ENCOUNTER — Other Ambulatory Visit (HOSPITAL_BASED_OUTPATIENT_CLINIC_OR_DEPARTMENT_OTHER): Payer: BC Managed Care – PPO | Admitting: Lab

## 2012-02-17 ENCOUNTER — Ambulatory Visit (HOSPITAL_BASED_OUTPATIENT_CLINIC_OR_DEPARTMENT_OTHER): Payer: BC Managed Care – PPO | Admitting: Oncology

## 2012-02-17 ENCOUNTER — Encounter: Payer: Self-pay | Admitting: Oncology

## 2012-02-17 VITALS — BP 103/61 | HR 98 | Temp 97.1°F | Resp 18 | Ht 63.5 in | Wt 115.1 lb

## 2012-02-17 DIAGNOSIS — C549 Malignant neoplasm of corpus uteri, unspecified: Secondary | ICD-10-CM

## 2012-02-17 DIAGNOSIS — C541 Malignant neoplasm of endometrium: Secondary | ICD-10-CM

## 2012-02-17 LAB — CBC WITH DIFFERENTIAL/PLATELET
BASO%: 0.2 % (ref 0.0–2.0)
Basophils Absolute: 0 10*3/uL (ref 0.0–0.1)
EOS%: 0.3 % (ref 0.0–7.0)
HCT: 27.3 % — ABNORMAL LOW (ref 34.8–46.6)
HGB: 9.5 g/dL — ABNORMAL LOW (ref 11.6–15.9)
MCH: 33.5 pg (ref 25.1–34.0)
MCHC: 34.7 g/dL (ref 31.5–36.0)
MCV: 96.4 fL (ref 79.5–101.0)
MONO%: 8.5 % (ref 0.0–14.0)
NEUT%: 74.9 % (ref 38.4–76.8)
lymph#: 0.9 10*3/uL (ref 0.9–3.3)

## 2012-02-17 LAB — COMPREHENSIVE METABOLIC PANEL (CC13)
ALT: 14 U/L (ref 0–55)
AST: 15 U/L (ref 5–34)
Alkaline Phosphatase: 106 U/L (ref 40–150)
BUN: 15 mg/dL (ref 7.0–26.0)
Creatinine: 0.8 mg/dL (ref 0.6–1.1)
Total Bilirubin: 0.28 mg/dL (ref 0.20–1.20)

## 2012-02-17 NOTE — Progress Notes (Signed)
OFFICE PROGRESS NOTE   02/17/2012   Physicians:W.Brewster, J.Fernandez, C.McAlhany, J.Kinard   INTERVAL HISTORY:  Patient is seen, alone for visit, in continuing attention to her IIIC endometrial carcinoma, post cycle 5 taxol/ carboplatin on 02-03-12 with neulasta 02-04-12  Patient had new onset of postmenopausal bleeding in March 2013, seen promptly by Dr Reynaldo Minium. Pathology of endometrial biopsy ([id number], [facility]) showed adenocarcinoma with immunohistochemistry favoring endometrial, FIGO grade II. She had CT AP at [facility] on 06-10-11, and CXR. She had preoperative cardiology consult by Dr.Christopher McAlhany. She was referred to Dr Festus Holts in gyn oncology at [facility], with pathology reviewed and confirmed. She had total robotic hysterectomy, BSO and bilateral pelvic lymph node dissection at [facility] 07-21-2011, tolerated well. Pathology [id number]) showed endometroid adenocarcinoma invading outer half of myometrium 2 cm where wall thickness 2.2 cm, FIGO grade 2, no LVSI identified. One of 6 right pelvic nodes and 0 of 7 left pelvic nodes were involved, cervix and bilateral tubes and ovaries without malignancy. Patient's postoperative course was been unremarkable. She was seen back by Dr Tresa Endo for postoperative visit on 08-08-2011, apparently recommendation for treatment on GOG 258, which she declined at my new patient consultation. She requested further care in [place] as this is most convenient for her. She did generally well with first 3 cycles of chemotherapy, did require neulasta.She had external beam radiation thru late Sept 2013 and HDR x3 thru 01-05-12. Plan is total of 6 cycles of chemotherapy, then repeat scans and reevaluation by gyn oncology in [place].  Patient continues to tolerate the chemotherapy well overall. She has been able to work this week and has had no significant nausea or peripheral neuropathy symptoms. She denies abdominal or pelvic  discomfort, bowels are moving well and she is voiding without difficulty. She has had no bleeding and no fever or symptoms of infection. Remainder of 10 point Review of Systems negative.  Objective:  Vital signs in last 24 hours:  BP 103/61  Pulse 98  Temp 97.1 F (36.2 C) (Oral)  Resp 18  Ht 5' 3.5" (1.613 m)  Wt 115 lb 1.6 oz (52.209 kg)  BMI 20.07 kg/m2  Easily ambulatory, respirations not labored, alopecia  HEENT:PERRLA, sclera clear, anicteric and oropharynx clear, no lesions LymphaticsCervical, supraclavicular, and axillary nodes normal. No inguinal adenopathy Resp: clear to auscultation bilaterally and normal percussion bilaterally Cardio: regular rate and rhythm GI: soft, non-tender; bowel sounds normal; no masses,  no organomegaly Extremities: extremities normal, atraumatic, no cyanosis or edema Neuro:no sensory deficits noted No portacath Skin without ecchymosis or rash Lab Results:  Results for orders placed in visit on 02/17/12  CBC WITH DIFFERENTIAL      Component Value Range   WBC 5.9  3.9 - 10.3 10e3/uL   NEUT# 4.4  1.5 - 6.5 10e3/uL   HGB 9.5 (*) 11.6 - 15.9 g/dL   HCT 62.1 (*) 30.8 - 65.7 %   Platelets 125 (*) 145 - 400 10e3/uL   MCV 96.4  79.5 - 101.0 fL   MCH 33.5  25.1 - 34.0 pg   MCHC 34.7  31.5 - 36.0 g/dL   RBC 8.46 (*) 9.62 - 9.52 10e6/uL   RDW 13.1  11.2 - 14.5 %   lymph# 0.9  0.9 - 3.3 10e3/uL   MONO# 0.5  0.1 - 0.9 10e3/uL   Eosinophils Absolute 0.0  0.0 - 0.5 10e3/uL   Basophils Absolute 0.0  0.0 - 0.1 10e3/uL   NEUT% 74.9  38.4 - 76.8 %  LYMPH% 16.1  14.0 - 49.7 %   MONO% 8.5  0.0 - 14.0 %   EOS% 0.3  0.0 - 7.0 %   BASO% 0.2  0.0 - 2.0 %  COMPREHENSIVE METABOLIC PANEL (CC13)      Component Value Range   Sodium 140  136 - 145 mEq/L   Potassium 4.0  3.5 - 5.1 mEq/L   Chloride 111 (*) 98 - 107 mEq/L   CO2 25  22 - 29 mEq/L   Glucose 91  70 - 99 mg/dl   BUN 42.5  7.0 - 95.6 mg/dL   Creatinine 0.8  0.6 - 1.1 mg/dL   Total Bilirubin  3.87  0.20 - 1.20 mg/dL   Alkaline Phosphatase 106  40 - 150 U/L   AST 15  5 - 34 U/L   ALT 14  0 - 55 U/L   Total Protein 6.1 (*) 6.4 - 8.3 g/dL   Albumin 3.2 (*) 3.5 - 5.0 g/dL   Calcium 9.2  8.4 - 56.4 mg/dL     Studies/Results:  No results found.  Medications: I have reviewed the patient's current medications. She has had flu vaccine  Assessment/Plan: 1. IIIC endometrial carcinoma: history as above, last planned adjuvant chemotherapy due 02-24-12 as long as ANC >=1.5 and plt >=100k; neulasta 11-30. She will have CT AP shortly prior to seeing Dr Duard Brady on Dec 18, and will have repeat CBC done when she is at [facility] on Dec 18. I will see her at least in Jan in follow up of the chemotherapy, or sooner if needed; she is to see Dr Roselind Messier next in April 2.overdue mammograms, which need to be scheduled after this treatment completes.   Patient is in agreement with plan above.   Jock Mahon P, MD   02/17/2012, 8:30 PM

## 2012-02-17 NOTE — Patient Instructions (Signed)
Appointments as scheduled. 

## 2012-02-18 ENCOUNTER — Other Ambulatory Visit: Payer: Self-pay | Admitting: Oncology

## 2012-02-18 DIAGNOSIS — C541 Malignant neoplasm of endometrium: Secondary | ICD-10-CM

## 2012-02-20 ENCOUNTER — Other Ambulatory Visit: Payer: Self-pay | Admitting: Oncology

## 2012-02-22 ENCOUNTER — Telehealth: Payer: Self-pay | Admitting: Oncology

## 2012-02-22 ENCOUNTER — Ambulatory Visit: Payer: BC Managed Care – PPO | Admitting: Lab

## 2012-02-22 NOTE — Telephone Encounter (Signed)
called and l/m with 11/29 appt info and to get a sch if needed    Tracey Oneill

## 2012-02-24 ENCOUNTER — Ambulatory Visit (HOSPITAL_BASED_OUTPATIENT_CLINIC_OR_DEPARTMENT_OTHER): Payer: BC Managed Care – PPO

## 2012-02-24 ENCOUNTER — Other Ambulatory Visit (HOSPITAL_BASED_OUTPATIENT_CLINIC_OR_DEPARTMENT_OTHER): Payer: BC Managed Care – PPO | Admitting: Lab

## 2012-02-24 VITALS — BP 136/69 | HR 105 | Temp 97.9°F | Resp 20

## 2012-02-24 DIAGNOSIS — C541 Malignant neoplasm of endometrium: Secondary | ICD-10-CM

## 2012-02-24 DIAGNOSIS — C549 Malignant neoplasm of corpus uteri, unspecified: Secondary | ICD-10-CM

## 2012-02-24 DIAGNOSIS — Z5111 Encounter for antineoplastic chemotherapy: Secondary | ICD-10-CM

## 2012-02-24 LAB — CBC WITH DIFFERENTIAL/PLATELET
BASO%: 0 % (ref 0.0–2.0)
Basophils Absolute: 0 10*3/uL (ref 0.0–0.1)
EOS%: 0 % (ref 0.0–7.0)
HCT: 27.9 % — ABNORMAL LOW (ref 34.8–46.6)
HGB: 9.5 g/dL — ABNORMAL LOW (ref 11.6–15.9)
MCH: 31.7 pg (ref 25.1–34.0)
MCHC: 34.1 g/dL (ref 31.5–36.0)
MONO#: 0.1 10*3/uL (ref 0.1–0.9)
NEUT%: 88.3 % — ABNORMAL HIGH (ref 38.4–76.8)
RDW: 13.8 % (ref 11.2–14.5)
WBC: 5.4 10*3/uL (ref 3.9–10.3)
lymph#: 0.6 10*3/uL — ABNORMAL LOW (ref 0.9–3.3)

## 2012-02-24 MED ORDER — ONDANSETRON 16 MG/50ML IVPB (CHCC)
16.0000 mg | Freq: Once | INTRAVENOUS | Status: AC
  Administered 2012-02-24: 16 mg via INTRAVENOUS

## 2012-02-24 MED ORDER — DIPHENHYDRAMINE HCL 50 MG/ML IJ SOLN
50.0000 mg | Freq: Once | INTRAMUSCULAR | Status: AC
  Administered 2012-02-24: 50 mg via INTRAVENOUS

## 2012-02-24 MED ORDER — SODIUM CHLORIDE 0.9 % IV SOLN
420.0000 mg | Freq: Once | INTRAVENOUS | Status: AC
  Administered 2012-02-24: 420 mg via INTRAVENOUS
  Filled 2012-02-24: qty 42

## 2012-02-24 MED ORDER — PACLITAXEL CHEMO INJECTION 300 MG/50ML
175.0000 mg/m2 | Freq: Once | INTRAVENOUS | Status: AC
  Administered 2012-02-24: 270 mg via INTRAVENOUS
  Filled 2012-02-24: qty 45

## 2012-02-24 MED ORDER — SODIUM CHLORIDE 0.9 % IV SOLN
Freq: Once | INTRAVENOUS | Status: AC
  Administered 2012-02-24: 11:00:00 via INTRAVENOUS

## 2012-02-24 MED ORDER — DEXAMETHASONE SODIUM PHOSPHATE 4 MG/ML IJ SOLN
20.0000 mg | Freq: Once | INTRAMUSCULAR | Status: AC
  Administered 2012-02-24: 20 mg via INTRAVENOUS

## 2012-02-24 MED ORDER — FAMOTIDINE IN NACL 20-0.9 MG/50ML-% IV SOLN
20.0000 mg | Freq: Once | INTRAVENOUS | Status: AC
  Administered 2012-02-24: 20 mg via INTRAVENOUS

## 2012-02-24 NOTE — Patient Instructions (Addendum)
[facility] Discharge Instructions for Patients Receiving Chemotherapy  Today you received the following chemotherapy agents Taxol/Carboplatin  To help prevent nausea and vomiting after your treatment, we encourage you to take your nausea medication   If you develop nausea and vomiting that is not controlled by your nausea medication, call the clinic. If it is after clinic hours your family physician or the after hours number for the clinic or go to the Emergency Department.   BELOW ARE SYMPTOMS THAT SHOULD BE REPORTED IMMEDIATELY:  *FEVER GREATER THAN 100.5 F  *CHILLS WITH OR WITHOUT FEVER  NAUSEA AND VOMITING THAT IS NOT CONTROLLED WITH YOUR NAUSEA MEDICATION  *UNUSUAL SHORTNESS OF BREATH  *UNUSUAL BRUISING OR BLEEDING  TENDERNESS IN MOUTH AND THROAT WITH OR WITHOUT PRESENCE OF ULCERS  *URINARY PROBLEMS  *BOWEL PROBLEMS  UNUSUAL RASH Items with * indicate a potential emergency and should be followed up as soon as possible.  One of the nurses will contact you 24 hours after your treatment. Please let the nurse know about any problems that you may have experienced. Feel free to call the clinic you have any questions or concerns. The clinic phone number is [phone].   I have been informed and understand all the instructions given to me. I know to contact the clinic, my physician, or go to the Emergency Department if any problems should occur. I do not have any questions at this time, but understand that I may call the clinic during office hours   should I have any questions or need assistance in obtaining follow up care.    __________________________________________  _____________  __________ Signature of Patient or Authorized Representative            Date                   Time    __________________________________________ Nurse's Signature    

## 2012-02-25 ENCOUNTER — Ambulatory Visit (HOSPITAL_BASED_OUTPATIENT_CLINIC_OR_DEPARTMENT_OTHER): Payer: BC Managed Care – PPO

## 2012-02-25 VITALS — BP 117/73 | HR 99 | Temp 97.0°F

## 2012-02-25 DIAGNOSIS — C549 Malignant neoplasm of corpus uteri, unspecified: Secondary | ICD-10-CM

## 2012-02-25 DIAGNOSIS — C541 Malignant neoplasm of endometrium: Secondary | ICD-10-CM

## 2012-02-25 MED ORDER — PEGFILGRASTIM INJECTION 6 MG/0.6ML
6.0000 mg | Freq: Once | SUBCUTANEOUS | Status: AC
  Administered 2012-02-25: 6 mg via SUBCUTANEOUS

## 2012-03-06 ENCOUNTER — Telehealth: Payer: Self-pay | Admitting: *Deleted

## 2012-03-06 NOTE — Telephone Encounter (Signed)
Received call from pt reporting "when I used the vaginal wall dilator, I noticed very small amount of yellow-greenish discharge on tip and that it smelled bad"  Pt denies any fever, pain, or bleeding.  States that she is cleaning dilator after each use but just wanted MD to be aware.  Re-iterated to pt to call if any fever/chills, etc develop and MD will be made aware.  Note to Dr. Darrold Span.

## 2012-03-06 NOTE — Telephone Encounter (Signed)
Called and left message on home # with instructions to call Radiation Oncology if any further problems from discussion this afternoon.

## 2012-03-12 ENCOUNTER — Ambulatory Visit (HOSPITAL_COMMUNITY)
Admission: RE | Admit: 2012-03-12 | Discharge: 2012-03-12 | Disposition: A | Discharge status: Home / Self Care | Payer: BC Managed Care – PPO | Source: Ambulatory Visit | Attending: Oncology | Admitting: Oncology

## 2012-03-12 DIAGNOSIS — I700 Atherosclerosis of aorta: Secondary | ICD-10-CM | POA: Insufficient documentation

## 2012-03-12 DIAGNOSIS — Z90710 Acquired absence of both cervix and uterus: Secondary | ICD-10-CM | POA: Insufficient documentation

## 2012-03-12 DIAGNOSIS — Z9221 Personal history of antineoplastic chemotherapy: Secondary | ICD-10-CM | POA: Insufficient documentation

## 2012-03-12 DIAGNOSIS — C541 Malignant neoplasm of endometrium: Secondary | ICD-10-CM

## 2012-03-12 DIAGNOSIS — Z09 Encounter for follow-up examination after completed treatment for conditions other than malignant neoplasm: Secondary | ICD-10-CM | POA: Insufficient documentation

## 2012-03-12 DIAGNOSIS — Z923 Personal history of irradiation: Secondary | ICD-10-CM | POA: Insufficient documentation

## 2012-03-12 DIAGNOSIS — C549 Malignant neoplasm of corpus uteri, unspecified: Secondary | ICD-10-CM | POA: Insufficient documentation

## 2012-03-14 ENCOUNTER — Encounter: Payer: Self-pay | Admitting: Gynecologic Oncology

## 2012-03-14 ENCOUNTER — Ambulatory Visit: Payer: BC Managed Care – PPO | Attending: Gynecologic Oncology | Admitting: Gynecologic Oncology

## 2012-03-14 VITALS — BP 142/60 | HR 98 | Temp 98.4°F | Resp 18 | Ht 63.0 in | Wt 114.8 lb

## 2012-03-14 DIAGNOSIS — Z923 Personal history of irradiation: Secondary | ICD-10-CM | POA: Insufficient documentation

## 2012-03-14 DIAGNOSIS — Z9221 Personal history of antineoplastic chemotherapy: Secondary | ICD-10-CM | POA: Insufficient documentation

## 2012-03-14 DIAGNOSIS — Z800 Family history of malignant neoplasm of digestive organs: Secondary | ICD-10-CM | POA: Insufficient documentation

## 2012-03-14 DIAGNOSIS — C541 Malignant neoplasm of endometrium: Secondary | ICD-10-CM

## 2012-03-14 DIAGNOSIS — Z09 Encounter for follow-up examination after completed treatment for conditions other than malignant neoplasm: Secondary | ICD-10-CM | POA: Insufficient documentation

## 2012-03-14 DIAGNOSIS — C549 Malignant neoplasm of corpus uteri, unspecified: Secondary | ICD-10-CM | POA: Insufficient documentation

## 2012-03-14 NOTE — Patient Instructions (Signed)
Follow up as scheduled.  

## 2012-03-14 NOTE — Progress Notes (Signed)
Consult Note: Gyn-Onc  Leo Weyandt 61 y.o. female  CC:  Chief Complaint  Patient presents with  . Endometrial cancer    Follow up    HPI: Patient had new onset of postmenopausal bleeding in March 2013, seen promptly by Dr Reynaldo Minium. Pathology of endometrial biopsy ([id number], [facility]) showed adenocarcinoma with immunohistochemistry favoring endometrial, FIGO grade II. She had CT AP at [facility] on 06-10-11, and CXR. She had preoperative cardiology consult by Dr.Christopher McAlhany. She was referred to Dr Festus Holts in gyn oncology at [facility], with pathology reviewed and confirmed. She had total robotic hysterectomy, BSO and bilateral pelvic lymph node dissection at [facility] 07-21-2011, tolerated well. Pathology [phone]) showed endometrioid adenocarcinoma invading outer half of myometrium 2 cm where wall thickness 2.2 cm, FIGO grade 2, no LVSI identified. One of 6 right pelvic nodes and 0 of 7 left pelvic nodes were involved, cervix and bilateral tubes and ovaries without malignancy. Patient's postoperative course was been unremarkable. She was seen back by Dr Tresa Endo for postoperative visit on 08-08-2011, apparently recommendation for treatment on GOG 258, which she declined at my new patient consultation. Her stage is  IIIC1.  She requested further care in [place] as this is most convenient for her. She did generally well with first 3 cycles of chemotherapy, did require neulasta.She had external beam radiation thru late Sept 2013 and HDR x3 thru 01-05-12. Plan is total of 6 cycles of chemotherapy, then repeat scans and reevaluation by gyn oncology in [place]. She completed all of her chemotherapy in 11/13.  Interval History:  Abdomen/pelvis: 9mm hypodense structure within the right hepatic lobe is identified, image 25/series 2. This is unchanged from previous exam. Gallbladder appears normal. No biliary dilatation. The pancreas is unremarkable. Normal appearance of  the spleen. Bilateral adrenal glands are unremarkable. 4 mm hypodensity within the posterior right kidney is stable, image 29/series 2. Left kidney is negative. Urinary bladder appears normal. Previous hysterectomy. Calcified atherosclerotic disease affects the abdominal aorta. No aneurysm identified. There is no enlarged lymph node within the upper abdomen. There is no pelvic or inguinal adenopathy identified. No ascites present within the abdomen or the pelvis.  There is no peritoneal nodule or mass identified. Bones/Musculoskeletal: Review of the visualized osseous  structures is significant for mild lumbar degenerative disc disease. No aggressive lytic or sclerotic bone lesions identified.  IMPRESSION:  1. No acute findings.  2. Stable low attenuation, indeterminant structure within segment 5 of the liver.  3. Status post total abdominal hysterectomy.    Review of Systems Constitutional  Feels well,  Cardiovascular  No chest pain, shortness of breath, or edema  Pulmonary  No cough or wheeze.  Gastro Intestinal  No nausea, vomitting, or diarrhoea. No bright red blood per rectum, no abdominal pain, change in bowel movement, or constipation.  Genito Urinary  No frequency, urgency, dysuria, no vaginal bleeding or discharge  Musculo Skeletal  No myalgia, arthralgia, joint swelling or pain  Neurologic  No weakness, numbness, change in gait,  Psychology  No depression, anxiety, insomnia.   Current Meds:  Outpatient Encounter Prescriptions as of 03/14/2012  Medication Sig Dispense Refill  . dexamethasone (DECADRON) 4 MG tablet Take 5 tabs with food 12 and 6 hours prior to taxol chemotherapy.  30 tablet  0  . loperamide (IMODIUM A-D) 2 MG tablet Take 2 mg by mouth 4 (four) times daily as needed.      Marland Kitchen LORazepam (ATIVAN) 0.5 MG tablet Take 1 tablet under the  tongue or swallow every 4-6 hours as needed for nausea. Will make drowsy.  30 tablet  0  . ondansetron (ZOFRAN) 8 MG tablet Take 1-2  tablets (8-16 mg total) by mouth every 12 (twelve) hours as needed for nausea. Will not make you sleepy.  30 tablet  0    Allergy:  Allergies  Allergen Reactions  . Neosporin (Neomycin-Bacitracin Zn-Polymyx) Rash  . Codeine Nausea And Vomiting    Social Hx:   History   Social History  . Marital Status: Single    Spouse Name: N/A    Number of Children: 2  . Years of Education: N/A   Occupational History  . Nurse tech at [employer]    Social History Main Topics  . Smoking status: Never Smoker   . Smokeless tobacco: Never Used  . Alcohol Use: No  . Drug Use: No  . Sexually Active: No   Other Topics Concern  . Not on file   Social History Narrative  . No narrative on file    Past Surgical Hx:  Past Surgical History  Procedure Date  . Bunionectomy 1991    LEFT  . Tubal ligation   . Abdominal hysterectomy     Past Medical Hx:  Past Medical History  Diagnosis Date  . Cancer     malignant neoplasm of corpus uteri except  isthmus  . Postmenopausal   . History of radiation therapy 11/17/11-12/22/11    endometrial ca   . History of brachytherapy 10/3,7,12/2011    vaginal intracavitary 4500 cGy 25 fx    Family Hx:  Family History  Problem Relation Age of Onset  . Cancer Sister     COLON  . Diabetes Sister     younger  . Diabetes Sister     older  . Stroke Father   . Heart attack Sister 16    Vitals:  Blood pressure 142/60, pulse 98, temperature 98.4 F (36.9 C), temperature source Oral, resp. rate 18, height 5\' 3"  (1.6 m), weight 114 lb 12.8 oz (52.073 kg).  Physical Exam: WD in NAD  Neck: Supple NROM, without any enlargements.   Lymph Node Survey: No cervical supraclavicular or inguinal adenopathy.  Cardiovascular: Pulse normal rate, regularity and rhythm. S1 and S2 normal.   Lungs:Clear to auscultation bilateraly, without wheezes/crackles/rhonchi. Good air movement.   Skin: No rash/lesions/breakdown.   Psychiatry: Alert and oriented to  person, place, and time.   Abdomen: Normoactive bowel sounds, abdomen soft, non-tender and obese. Lsc surgical sites intact without evidence of hernia.   Back:No CVA tenderness.  Genito Urinary:  Vulva/vagina: Normal external female genitalia. No lesions.  Bladder/urethra: No lesions or masses  Vagina: Cuff is intact, no discharge or bleeding.  No palpable masses.   Extremities: No bilateral cyanosis, clubbing or edema.  Assessment/Plan:  61 year old with IIIC1 grade 2 endometrial cancer status post definitive surgery with chemotherapy and radiation in a sandwich fashion. Posttreatment CT scan is negative. She is very happy with these results and is feeling quite well. She has a negative examination today. She has followup scheduled with Dr. Darrold Span in January and with Dr. Roselind Messier in April. She return to see [place] in July. She will call us if there's any issues prior to her next appointment. Vannia Pola A., MD 03/14/2012, 11:34 AM

## 2012-04-18 ENCOUNTER — Ambulatory Visit: Payer: BC Managed Care – PPO | Admitting: Oncology

## 2012-04-18 ENCOUNTER — Other Ambulatory Visit: Payer: BC Managed Care – PPO | Admitting: Lab

## 2012-04-19 ENCOUNTER — Other Ambulatory Visit: Payer: Self-pay | Admitting: Oncology

## 2012-04-19 DIAGNOSIS — Z1231 Encounter for screening mammogram for malignant neoplasm of breast: Secondary | ICD-10-CM

## 2012-04-19 NOTE — Progress Notes (Signed)
Med Onc  FTKA or cancelled 04-18-12. POF sent to schedulers now to reschedule in Feb as patient is able with her work schedule, CBC and CMET with that visit. Note she saw Dr Duard Brady 03-14-12 and will see her again in July 2014, Dr Roselind Messier will be April 2014.   Tracey Mcgill, MD

## 2012-04-20 ENCOUNTER — Telehealth: Payer: Self-pay | Admitting: Oncology

## 2012-04-20 NOTE — Telephone Encounter (Signed)
Per pof I called the pt to r/s her ftka appts and mammo and I left a mess for her to call      anne

## 2012-04-25 ENCOUNTER — Other Ambulatory Visit: Payer: Self-pay | Admitting: Oncology

## 2012-04-25 ENCOUNTER — Telehealth: Payer: Self-pay | Admitting: Oncology

## 2012-04-25 DIAGNOSIS — C541 Malignant neoplasm of endometrium: Secondary | ICD-10-CM

## 2012-04-25 NOTE — Telephone Encounter (Signed)
called pt and l/m for her to call to r/s her ftka appt and mammo     anne

## 2012-06-05 ENCOUNTER — Telehealth: Payer: Self-pay

## 2012-06-05 NOTE — Telephone Encounter (Signed)
Left a message in Ms. Buczkowski vm to call Dr. Precious Reel office to r/s missed appointment in January with Dr. Darrold Span.

## 2012-06-12 ENCOUNTER — Telehealth: Payer: Self-pay

## 2012-06-12 NOTE — Telephone Encounter (Signed)
Left message for Ms. Gargan to call Dr. Precious Reel Schedulers to R/S missed appointment in January 2014.  The appointment in July is for Dr. Rica Records and is not scheduled yet by Telford Nab in GYN ONC.

## 2012-06-19 ENCOUNTER — Telehealth: Payer: Self-pay | Admitting: Oncology

## 2012-06-19 NOTE — Telephone Encounter (Signed)
Per LL 4/4 appt r/s due to appt too late in the day. appt moved to 4/16. lmonvm informing pt of change and new d/t. Schedule mailed.

## 2012-06-29 ENCOUNTER — Other Ambulatory Visit: Payer: BC Managed Care – PPO | Admitting: Lab

## 2012-06-29 ENCOUNTER — Ambulatory Visit: Payer: BC Managed Care – PPO | Admitting: Oncology

## 2012-07-11 ENCOUNTER — Other Ambulatory Visit (HOSPITAL_BASED_OUTPATIENT_CLINIC_OR_DEPARTMENT_OTHER): Payer: BC Managed Care – PPO | Admitting: Lab

## 2012-07-11 ENCOUNTER — Ambulatory Visit (HOSPITAL_BASED_OUTPATIENT_CLINIC_OR_DEPARTMENT_OTHER): Payer: BC Managed Care – PPO | Admitting: Oncology

## 2012-07-11 ENCOUNTER — Encounter: Payer: Self-pay | Admitting: Oncology

## 2012-07-11 ENCOUNTER — Telehealth: Payer: Self-pay | Admitting: Oncology

## 2012-07-11 VITALS — BP 113/60 | HR 86 | Temp 97.3°F | Resp 18 | Ht 63.0 in | Wt 120.0 lb

## 2012-07-11 DIAGNOSIS — C549 Malignant neoplasm of corpus uteri, unspecified: Secondary | ICD-10-CM

## 2012-07-11 DIAGNOSIS — Z1231 Encounter for screening mammogram for malignant neoplasm of breast: Secondary | ICD-10-CM

## 2012-07-11 DIAGNOSIS — C541 Malignant neoplasm of endometrium: Secondary | ICD-10-CM

## 2012-07-11 LAB — COMPREHENSIVE METABOLIC PANEL (CC13)
AST: 16 U/L (ref 5–34)
Alkaline Phosphatase: 84 U/L (ref 40–150)
BUN: 20.6 mg/dL (ref 7.0–26.0)
Calcium: 9.3 mg/dL (ref 8.4–10.4)
Potassium: 4 mEq/L (ref 3.5–5.1)
Sodium: 141 mEq/L (ref 136–145)

## 2012-07-11 LAB — CBC WITH DIFFERENTIAL/PLATELET
EOS%: 0.9 % (ref 0.0–7.0)
MCH: 30.4 pg (ref 25.1–34.0)
MCV: 91.5 fL (ref 79.5–101.0)
MONO%: 11.3 % (ref 0.0–14.0)
RBC: 4.02 10*6/uL (ref 3.70–5.45)
RDW: 12.3 % (ref 11.2–14.5)

## 2012-07-11 NOTE — Telephone Encounter (Signed)
Per 4/16 pof f/u prn. gv pt appt for mammo @ [facility] 07/16/12 @ 11:45am and appt w/Dr. Duard Brady 7/10.

## 2012-07-11 NOTE — Progress Notes (Signed)
OFFICE PROGRESS NOTE   07/11/2012   Physicians:W.Brewster, J.Fernandez, C.McAlhany, J.Kinard   INTERVAL HISTORY:   Patient is seen, alone for visit, in follow up of adjuvant taxol carboplatin chemotherapy given for IIIC endometrial carcinoma. Chemotherapy was completed 02-24-12, last scans were CT AP 03-12-13 and she is to see Dr Roselind Messier later this week, then Dr Duard Brady in July.   Patient had new onset of postmenopausal bleeding in March 2013, seen promptly by Dr Reynaldo Minium. Pathology of endometrial biopsy ([id number], [facility]) showed adenocarcinoma with immunohistochemistry favoring endometrial, FIGO grade II. She had CT AP at [facility] on 06-10-11, and CXR. She had preoperative cardiology consult by Dr.Christopher McAlhany. She was referred to Dr Festus Holts in gyn oncology at [facility], with pathology reviewed and confirmed. She had total robotic hysterectomy, BSO and bilateral pelvic lymph node dissection at [facility] 07-21-2011, tolerated well. Pathology [phone]) showed endometroid adenocarcinoma invading outer half of myometrium 2 cm where wall thickness 2.2 cm, FIGO grade 2, no LVSI identified. One of 6 right pelvic nodes and 0 of 7 left pelvic nodes were involved, cervix and bilateral tubes and ovaries without malignancy. Patient's postoperative course was been unremarkable. She was seen back by Dr Tresa Endo for postoperative visit on 08-08-2011, apparently recommendation for treatment on GOG 258, which she declined at my new patient consultation. She requested further care in [place] as this is most convenient for her. She did generally well with first 3 cycles of chemotherapy, did require neulasta.She had external beam radiation thru late Sept 2013 and HDR x3 thru 01-05-12, then completed additional 3 cycles of taxol carboplatin 02-24-12.  Patient has felt entirely well in past few months, with no complaints that seem referable to the gyn cancer or that treatment. Energy is  back to baseline, appetite good, no abdominal or pelvic symptoms, bowels moving regularly. She is at her prior usual weight. Remainder of 10 point Review of Systems negative.  Objective:  Vital signs in last 24 hours:  BP 113/60  Pulse 86  Temp(Src) 97.3 F (36.3 C) (Oral)  Resp 18  Ht 5\' 3"  (1.6 m)  Wt 120 lb (54.432 kg)  BMI 21.26 kg/m2  Weight is up 5 lbs from Nov. No alopecia. Easily mobile, looks comfortable.  HEENT:PERRLA, sclera clear, anicteric and oropharynx clear, no lesions LymphaticsCervical, supraclavicular, and axillary nodes normal. No inguinal adenopathy Resp: clear to auscultation bilaterally and normal percussion bilaterally Cardio: regular rate and rhythm GI: soft, non-tender; bowel sounds normal; no masses,  no organomegaly Surgical incisions well healed Extremities: extremities normal, atraumatic, no cyanosis or edema Neuro:no sensory deficits noted Breast:normal without suspicious masses, skin or nipple changes or axillary nodes   Lab Results:  Results for orders placed in visit on 07/11/12  CBC WITH DIFFERENTIAL      Result Value Range   WBC 5.0  3.9 - 10.3 10e3/uL   NEUT# 3.3  1.5 - 6.5 10e3/uL   HGB 12.2  11.6 - 15.9 g/dL   HCT 14.7  82.9 - 56.2 %   Platelets 241  145 - 400 10e3/uL   MCV 91.5  79.5 - 101.0 fL   MCH 30.4  25.1 - 34.0 pg   MCHC 33.2  31.5 - 36.0 g/dL   RBC 1.30  8.65 - 7.84 10e6/uL   RDW 12.3  11.2 - 14.5 %   lymph# 1.1  0.9 - 3.3 10e3/uL   MONO# 0.6  0.1 - 0.9 10e3/uL   Eosinophils Absolute 0.0  0.0 - 0.5 10e3/uL  Basophils Absolute 0.0  0.0 - 0.1 10e3/uL   NEUT% 64.9  38.4 - 76.8 %   LYMPH% 22.6  14.0 - 49.7 %   MONO% 11.3  0.0 - 14.0 %   EOS% 0.9  0.0 - 7.0 %   BASO% 0.3  0.0 - 2.0 %  COMPREHENSIVE METABOLIC PANEL (CC13)      Result Value Range   Sodium 141  136 - 145 mEq/L   Potassium 4.0  3.5 - 5.1 mEq/L   Chloride 107  98 - 107 mEq/L   CO2 22  22 - 29 mEq/L   Glucose 109 (*) 70 - 99 mg/dl   BUN 16.1  7.0 - 09.6  mg/dL   Creatinine 0.9  0.6 - 1.1 mg/dL   Total Bilirubin 0.45  0.20 - 1.20 mg/dL   Alkaline Phosphatase 84  40 - 150 U/L   AST 16  5 - 34 U/L   ALT 11  0 - 55 U/L   Total Protein 6.8  6.4 - 8.3 g/dL   Albumin 3.3 (*) 3.5 - 5.0 g/dL   Calcium 9.3  8.4 - 40.9 mg/dL     Studies/Results: She is overdue mammograms, which we will set up  Medications: I have reviewed the patient's current medications.  As blood counts have recovered and she has no residual problems from the chemotherapy, and as she is to be followed regularly by Drs Duard Brady and Roselind Messier, I will see her back at this office prn. She knows that I will be glad to see her at any time if she or other physicians feel that I can be of help.  Assessment/Plan:  1.IIIC endometrial carcinoma: history as above, clinically doing well. She will keep appointment with Dr Roselind Messier later this week and will see Dr Duard Brady next in July 2.overdue mammograms, which will be set up either at [facility] or [facility].  Patient is in agreement with plan above.   Reece Packer, MD   07/11/2012, 5:21 PM

## 2012-07-12 ENCOUNTER — Other Ambulatory Visit (HOSPITAL_COMMUNITY)
Admission: RE | Admit: 2012-07-12 | Discharge: 2012-07-12 | Disposition: A | Discharge status: Home / Self Care | Payer: BC Managed Care – PPO | Source: Ambulatory Visit | Attending: Radiation Oncology | Admitting: Radiation Oncology

## 2012-07-12 ENCOUNTER — Encounter: Payer: Self-pay | Admitting: Radiation Oncology

## 2012-07-12 ENCOUNTER — Ambulatory Visit
Admission: RE | Admit: 2012-07-12 | Discharge: 2012-07-12 | Disposition: A | Discharge status: Home / Self Care | Payer: BC Managed Care – PPO | Source: Ambulatory Visit | Attending: Radiation Oncology | Admitting: Radiation Oncology

## 2012-07-12 VITALS — BP 113/62 | HR 92 | Temp 97.8°F | Resp 16 | Wt 120.0 lb

## 2012-07-12 DIAGNOSIS — C541 Malignant neoplasm of endometrium: Secondary | ICD-10-CM

## 2012-07-12 DIAGNOSIS — Z01419 Encounter for gynecological examination (general) (routine) without abnormal findings: Secondary | ICD-10-CM | POA: Insufficient documentation

## 2012-07-12 NOTE — Progress Notes (Signed)
  Radiation Oncology         [phone] ________________________________  Name: Tracey Oneill MRN: [id number]  Date: 07/12/2012  DOB: 07-11-1950  Follow-Up Visit Note  CC: No primary provider on file.  Reece Packer, MD  Diagnosis:   Stage III-C endometrial adenocarcinoma  Interval Since Last Radiation:  6  months,  she completed external beam as well as intracavitary brachytherapy treatments   Narrative:  The patient returns today for routine follow-up.  She is doing well and without complaints. Her energy level is 100% .  She denies any pelvic pain flank pain low back pain, vaginal bleeding hematuria or rectal bleeding. She denies any problems with constipation or diarrhea. She continues to use her vaginal dilator.                              ALLERGIES:  is allergic to neosporin and codeine.  Meds: No current outpatient prescriptions on file.   No current facility-administered medications for this encounter.    Physical Findings: The patient is in no acute distress. Patient is alert and oriented.  weight is 120 lb (54.432 kg). Her oral temperature is 97.8 F (36.6 C). Her blood pressure is 113/62 and her pulse is 92. Her respiration is 16 and oxygen saturation is 100%. . No palpable supraclavicular or axillary adenopathy the lungs are clear to auscultation. The heart has a regular rhythm and rate. The abdomen is soft and nontender with normal bowel sounds. The inguinal areas are free of adenopathy. On pelvic examination the external genitalia are unremarkable. A speculum exam is performed. There are mild radiation changes noted in the proximal vagina. A Pap smear was obtained of the vaginal cuff region.  On bimanual and rectovaginal examination there no pelvic masses appreciated.  Lab Findings: Lab Results  Component Value Date   WBC 5.0 07/11/2012   HGB 12.2 07/11/2012   HCT 36.8 07/11/2012   MCV 91.5 07/11/2012   PLT 241 07/11/2012    Radiographic Findings: No results  found.  Impression:     Stage III-C endometrial adenocarcinoma.  No evidence of recurrence on clinical exam today, Pap smear pending.  Plan:  Routine followup in 6 months. Patient will be seen by gynecologic oncology in 3 months.  _____________________________________  -----------------------------------  Billie Lade, PhD, MD

## 2012-07-12 NOTE — Progress Notes (Signed)
Patient presents to the clinic today unaccompanied for follow up with Dr. Roselind Messier. Patient alert and oriented to person, place, and time. No distress noted. Steady gait noted. Pleasant affect noted. Patient denies pain at this time. Patient denies pelvic pain or rectal bleeding. Patient denies hematuria. Patient denies burning with urination. Patient denies vaginal bleeding, itching, discharge or odor. Patient reports using her dilator as directed. Patient denies diarrhea or constipation. Patient reports that her energy level has been good and she continues to work. Patient reports that Dr. Darrold Span released her yesterday and she plans to see Dr. Duard Brady in July. Reported all findings to Dr. Roselind Messier.

## 2012-07-17 ENCOUNTER — Telehealth: Payer: Self-pay | Admitting: Radiation Oncology

## 2012-07-17 NOTE — Telephone Encounter (Signed)
Per Dr. Trina Ao requested phone patient to inform her of normal result from recent pap smear. No answer. Left message requesting return call.

## 2012-07-17 NOTE — Telephone Encounter (Signed)
Message copied by Agnes Lawrence on Tue Jul 17, 2012  2:18 PM ------      Message from: Antony Blackbird D      Created: Tue Jul 17, 2012  1:44 PM      Regarding: Normal pap smear       Sam, please inform pt of good results.  jk      ----- Message -----         From: [facility] Interface         Sent: 07/17/2012  12:58 PM           To: Billie Lade, MD                   ------

## 2012-07-17 NOTE — Telephone Encounter (Signed)
Returned patient's call. Per Dr. Trina Ao order informed patient her pap smear results are normal. Patient expressed understanding and appreciation for the call.

## 2012-10-04 ENCOUNTER — Other Ambulatory Visit (HOSPITAL_COMMUNITY)
Admission: RE | Admit: 2012-10-04 | Discharge: 2012-10-04 | Disposition: A | Discharge status: Home / Self Care | Payer: BC Managed Care – PPO | Source: Ambulatory Visit | Attending: Gynecologic Oncology | Admitting: Gynecologic Oncology

## 2012-10-04 ENCOUNTER — Encounter: Payer: Self-pay | Admitting: Gynecologic Oncology

## 2012-10-04 ENCOUNTER — Ambulatory Visit: Payer: BC Managed Care – PPO | Attending: Gynecologic Oncology | Admitting: Gynecologic Oncology

## 2012-10-04 VITALS — BP 120/58 | HR 64 | Temp 98.7°F | Resp 16 | Ht 63.0 in | Wt 126.2 lb

## 2012-10-04 DIAGNOSIS — C541 Malignant neoplasm of endometrium: Secondary | ICD-10-CM

## 2012-10-04 DIAGNOSIS — Z90710 Acquired absence of both cervix and uterus: Secondary | ICD-10-CM | POA: Insufficient documentation

## 2012-10-04 DIAGNOSIS — Z9079 Acquired absence of other genital organ(s): Secondary | ICD-10-CM | POA: Insufficient documentation

## 2012-10-04 DIAGNOSIS — Z923 Personal history of irradiation: Secondary | ICD-10-CM | POA: Insufficient documentation

## 2012-10-04 DIAGNOSIS — K7689 Other specified diseases of liver: Secondary | ICD-10-CM | POA: Insufficient documentation

## 2012-10-04 DIAGNOSIS — C549 Malignant neoplasm of corpus uteri, unspecified: Secondary | ICD-10-CM | POA: Insufficient documentation

## 2012-10-04 DIAGNOSIS — Z9221 Personal history of antineoplastic chemotherapy: Secondary | ICD-10-CM | POA: Insufficient documentation

## 2012-10-04 DIAGNOSIS — Z01419 Encounter for gynecological examination (general) (routine) without abnormal findings: Secondary | ICD-10-CM | POA: Insufficient documentation

## 2012-10-04 NOTE — Progress Notes (Signed)
Consult Note: Gyn-Onc  Tracey Oneill 62 y.o. female  CC:  Chief Complaint  Patient presents with  . Endo ca    Follow-up appointment    HPI: Patient had new onset of postmenopausal bleeding in March 2013, seen promptly by Dr Reynaldo Minium. Pathology of endometrial biopsy ([id number], [facility]) showed adenocarcinoma with immunohistochemistry favoring endometrial, FIGO grade II. She had CT AP at [facility] on 06-10-11, and CXR. She had preoperative cardiology consult by Dr.Christopher McAlhany. She was referred to Dr Festus Holts in gyn oncology at [facility], with pathology reviewed and confirmed. She had total robotic hysterectomy, BSO and bilateral pelvic lymph node dissection at [facility] 07-21-2011, tolerated well. Pathology [phone]) showed endometrioid adenocarcinoma invading outer half of myometrium 2 cm where wall thickness 2.2 cm, FIGO grade 2, no LVSI identified. One of 6 right pelvic nodes and 0 of 7 left pelvic nodes were involved, cervix and bilateral tubes and ovaries without malignancy. Patient's postoperative course was been unremarkable. She was seen back by Dr Tresa Endo for postoperative visit on 08-08-2011, apparently recommendation for treatment on GOG 258, which she declined at my new patient consultation. Her stage is IIIC1.  She requested further care in [place] as this is most convenient for her. She did generally well with first 3 cycles of chemotherapy, did require neulasta.She had external beam radiation thru late Sept 2013 and HDR x3 thru 01-05-12. Plan is total of 6 cycles of chemotherapy, then repeat scans and reevaluation by gyn oncology in [place]. She completed all of her chemotherapy in 11/13.   Interval History:  CT 12/13: Abdomen/pelvis: 9mm hypodense structure within the right hepatic lobe is identified, image 25/series 2. This is unchanged from previous exam. Gallbladder appears normal. No biliary dilatation. The pancreas is unremarkable. Normal  appearance of the spleen. Bilateral adrenal glands are unremarkable. 4 mm hypodensity within the posterior right kidney is stable, image 29/series 2. Left kidney is negative. Urinary bladder appears normal. Previous hysterectomy. Calcified atherosclerotic disease affects the abdominal aorta. No aneurysm identified. There is no enlarged lymph node within the upper abdomen. There is no pelvic or inguinal adenopathy identified. No ascites present within the abdomen or the pelvis.  There is no peritoneal nodule or mass identified. Bones/Musculoskeletal: Review of the visualized osseous structures is significant for mild lumbar degenerative disc disease. No aggressive lytic or sclerotic bone lesions identified.  IMPRESSION:  1. No acute findings.  2. Stable low attenuation, indeterminant structure within segment 5 of the liver.  3. Status post total abdominal hysterectomy.   She was last seen by Dr. Darrold Span in April of 2014. At that time she been feeling very well energy is back to her baseline, her appetite was good and she had no other symptoms. She comes in today for followup. She was also seen by Dr. Sharlett Iles in April which time she had a negative examination and negative Pap smear.  Review of Systems:  Constitutional:  Denies fever. Skin: No rash, sores, jaundice, itching, or dryness.  Cardiovascular: No chest pain, shortness of breath, or edema  Pulmonary: No cough or wheeze.  Gastro Intestinal:  No nausea, vomiting, constipation, or diarrhea reported. No bright red blood per rectum or change in bowel movement. She gets a little gas the when she needs to consider gassy. She also surgery eating Activia yogurt makes her bowels somewhat irregular. She stay she's lactose intolerance and Activia is the only yogurt she tolerates and she likes it. Genitourinary: No frequency, urgency, or dysuria.  Denies vaginal  bleeding and discharge.  Musculoskeletal: No myalgia, arthralgia, joint swelling or pain.   Neurologic: No weakness, numbness, or change in gait.  Psychology: No changes   Current Meds:  No outpatient encounter prescriptions on file as of 10/04/2012.   No facility-administered encounter medications on file as of 10/04/2012.    Allergy:  Allergies  Allergen Reactions  . Neosporin (Neomycin-Bacitracin Zn-Polymyx) Rash  . Codeine Nausea And Vomiting    Social Hx:   History   Social History  . Marital Status: Single    Spouse Name: N/A    Number of Children: 2  . Years of Education: N/A   Occupational History  . Nurse tech at [employer]    Social History Main Topics  . Smoking status: Never Smoker   . Smokeless tobacco: Never Used  . Alcohol Use: No  . Drug Use: No  . Sexually Active: No   Other Topics Concern  . Not on file   Social History Narrative  . No narrative on file    Past Surgical Hx:  Past Surgical History  Procedure Laterality Date  . Bunionectomy  1991    LEFT  . Tubal ligation    . Abdominal hysterectomy      Past Medical Hx:  Past Medical History  Diagnosis Date  . Cancer     malignant neoplasm of corpus uteri except  isthmus  . Postmenopausal   . History of radiation therapy 11/17/11-12/22/11    endometrial ca   . History of brachytherapy 10/3,7,12/2011    vaginal intracavitary 4500 cGy 25 fx    Oncology Hx:    Endometrial carcinoma   06/26/2011 Initial Diagnosis Endometrial carcinoma   07/21/2011 Surgery [facility], IIIC 1/6 right pelvic nodes +    - 02/24/2012 Chemotherapy finished sandwich paclitaxel and carboplatin x 6 with external beam and vaginal brachytherapy    - 01/05/2012 Radiation Therapy     Family Hx:  Family History  Problem Relation Age of Onset  . Cancer Sister     COLON  . Diabetes Sister     younger  . Diabetes Sister     older  . Stroke Father   . Heart attack Sister 74    Vitals:  Blood pressure 120/58, pulse 64, temperature 98.7 F (37.1 C), temperature source Oral, resp.  rate 16, height 5\' 3"  (1.6 m), weight 126 lb 3.2 oz (57.244 kg).  Physical Exam:  WD in NAD   Neck: Supple NROM, without any enlargements.   Lymph Node Survey: No cervical supraclavicular or inguinal adenopathy.   Cardiovascular: Pulse normal rate, regularity and rhythm. S1 and S2 normal.   Lungs:Clear to auscultation bilateraly, without wheezes/crackles/rhonchi. Good air movement.   Skin: No rash/lesions/breakdown.   Psychiatry: Alert and oriented to person, place, and time.   Abdomen: Normoactive bowel sounds, abdomen soft, non-tender and obese. Lsc surgical sites intact without evidence of hernia.   Genito Urinary:  Vulva/vagina: Normal external female genitalia. No lesions.  Bladder/urethra: No lesions or masses  Vagina: Cuff is intact, no discharge or bleeding. Pap smear submitted No palpable masses. Rectal confirms  Extremities: No bilateral cyanosis, clubbing or edema.  Assessment/Plan:  62 year old with IIIC1 grade 2 endometrial cancer status post definitive surgery with chemotherapy and radiation in a sandwich fashion. Posttreatment CT scan is negative. She is very happy with these results and is feeling quite well. She has a negative examination today. She has followup scheduled with Dr. Roselind Messier in October. She return to see [place]  late December or early January with a CT scan prior to her visit . She will call us if there's any issues prior to her next appointment.  Assessment/Plan:   KGMWNU,UVOZD A., MD 10/04/2012, 1:25 PM

## 2012-10-17 ENCOUNTER — Ambulatory Visit (INDEPENDENT_AMBULATORY_CARE_PROVIDER_SITE_OTHER): Payer: BC Managed Care – PPO | Admitting: Emergency Medicine

## 2012-10-17 ENCOUNTER — Ambulatory Visit: Payer: BC Managed Care – PPO

## 2012-10-17 VITALS — BP 110/62 | HR 110 | Temp 98.1°F | Resp 18 | Ht 64.0 in | Wt 124.0 lb

## 2012-10-17 DIAGNOSIS — H6690 Otitis media, unspecified, unspecified ear: Secondary | ICD-10-CM

## 2012-10-17 DIAGNOSIS — J029 Acute pharyngitis, unspecified: Secondary | ICD-10-CM

## 2012-10-17 DIAGNOSIS — R05 Cough: Secondary | ICD-10-CM

## 2012-10-17 DIAGNOSIS — H6691 Otitis media, unspecified, right ear: Secondary | ICD-10-CM

## 2012-10-17 LAB — POCT CBC
Granulocyte percent: 74.2 %G (ref 37–80)
MCH, POC: 30.5 pg (ref 27–31.2)
MCV: 96.1 fL (ref 80–97)
MID (cbc): 0.8 (ref 0–0.9)
MPV: 7 fL (ref 0–99.8)
POC LYMPH PERCENT: 17.7 %L (ref 10–50)
POC MID %: 8.1 %M (ref 0–12)
Platelet Count, POC: 297 10*3/uL (ref 142–424)
RBC: 4.07 M/uL (ref 4.04–5.48)
RDW, POC: 12.9 %

## 2012-10-17 MED ORDER — AMOXICILLIN 875 MG PO TABS: 875.0000 mg | ORAL_TABLET | Freq: Two times a day (BID) | ORAL | Status: DC

## 2012-10-17 MED ORDER — BENZONATATE 100 MG PO CAPS: 100.0000 mg | ORAL_CAPSULE | Freq: Three times a day (TID) | ORAL | Status: DC | PRN

## 2012-10-17 NOTE — Progress Notes (Signed)
  Subjective:    Patient ID: Tracey Oneill, female    DOB: [birth year], 62 y.o.   MRN: [id number]  HPI  62 year old female presents with: Unproductive cough x 1 week.  Headache, sneezing, bodyaches Right earache No sick family members No color nasal discharge Occasionally coughs up green and thick phelm Non smoker  history of uterine cancer, hysterectomy with chemo and radiation   Review of Systems     Objective:   Physical Exam the right TM is red and landmarks distorted left TM is normal. The throat is slightly red. Neck is supple. Chest is clear to auscultation and percussion heart regular rate no murmurs   Results for orders placed in visit on 10/17/12  POCT CBC      Result Value Range   WBC 9.4  4.6 - 10.2 K/uL   Lymph, poc 1.7  0.6 - 3.4   POC LYMPH PERCENT 17.7  10 - 50 %L   MID (cbc) 0.8  0 - 0.9   POC MID % 8.1  0 - 12 %M   POC Granulocyte 7.0 (*) 2 - 6.9   Granulocyte percent 74.2  37 - 80 %G   RBC 4.07  4.04 - 5.48 M/uL   Hemoglobin 12.4  12.2 - 16.2 g/dL   HCT, POC 78.2  95.6 - 47.9 %   MCV 96.1  80 - 97 fL   MCH, POC 30.5  27 - 31.2 pg   MCHC 31.7 (*) 31.8 - 35.4 g/dL   RDW, POC 21.3     Platelet Count, POC 297  142 - 424 K/uL   MPV 7.0  0 - 99.8 fL  POCT RAPID STREP A (OFFICE)      Result Value Range   Rapid Strep A Screen Negative  Negative   [facility] reading (PRIMARY) by  Dr.Jaice Digioia is no acute disease.       Assessment & Plan:  Right OM . Will treat with amoxicillin twice a day and Tessalon Perles for cough

## 2012-10-25 ENCOUNTER — Telehealth: Payer: Self-pay | Admitting: Gynecologic Oncology

## 2012-10-25 NOTE — Telephone Encounter (Signed)
Attempted to call patient with results of pap smear.  Will retry at a later time.

## 2012-10-26 ENCOUNTER — Telehealth: Payer: Self-pay | Admitting: Gynecologic Oncology

## 2012-10-26 NOTE — Telephone Encounter (Signed)
Message left asking the patient to please call the office to discuss pap smear results.

## 2012-10-29 ENCOUNTER — Telehealth: Payer: Self-pay | Admitting: Gynecologic Oncology

## 2012-10-29 NOTE — Telephone Encounter (Signed)
Patient informed of pap smear results and Dr. Denman George recommendations for repeat pap at her visit with Dr. Roselind Messier in October.  Verbalizing understanding.  Advised to call for any questions or concerns.

## 2012-12-19 IMAGING — CT CT ABD-PELV W/ CM
2 of 6 series · 16 of 46 positions shown, 18 images · IV contrast ([ID] OMNI [id number])
Comparison: None.

CLINICAL DATA: Endometrial cancer

CT ABDOMEN AND PELVIS WITH CONTRAST
TECHNIQUE: Multidetector CT imaging of the abdomen and pelvis was
performed following the standard protocol during bolus
administration of intravenous contrast.
Contrast: 100mL OMNIPAQUE IOHEXOL 300 MG/ML IJ SOLN

[Series 2: rtn a/p with · axial · 0.74mm/px · z∈[+892,+1292]mm · 13 of 91 slices shown, 15 images]
[im 6/91  soft-tissue]
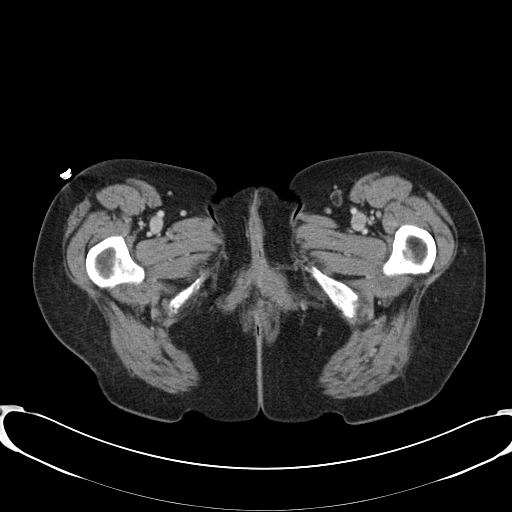
[im 6/91  bone]
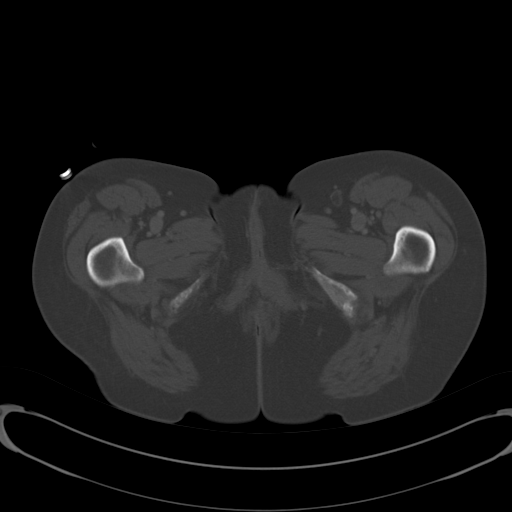
[im 11/91  soft-tissue]
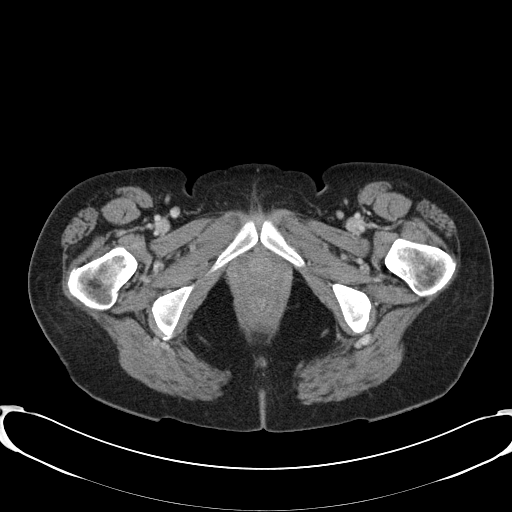
[im 21/91  soft-tissue]
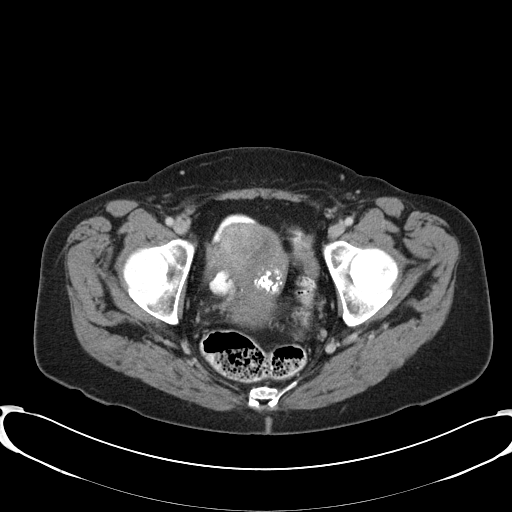
[im 26/91  soft-tissue]
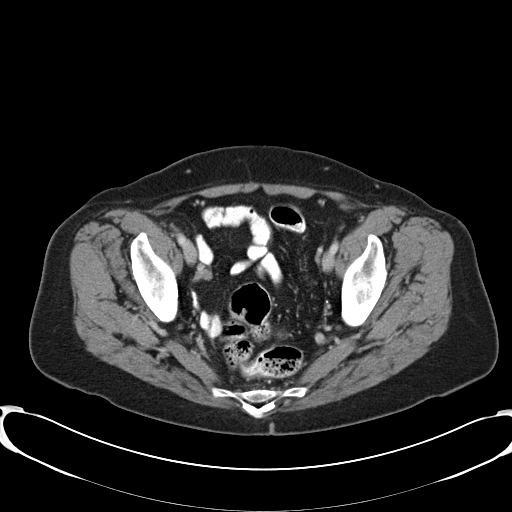
[im 31/91  soft-tissue]
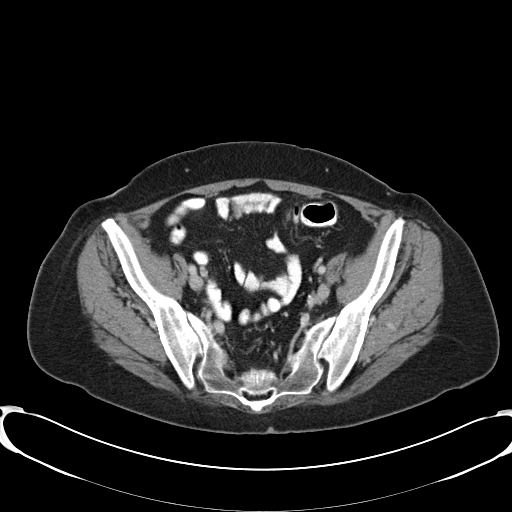
[im 41/91  soft-tissue]
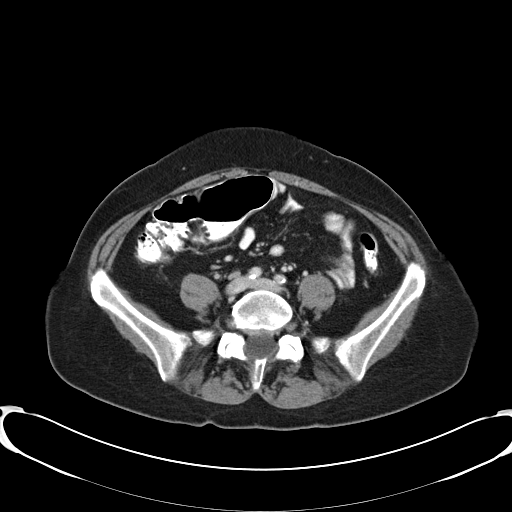
[im 46/91  soft-tissue]
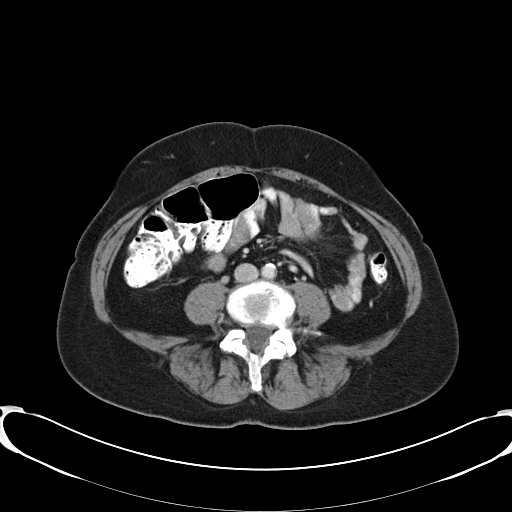
[im 51/91  soft-tissue]
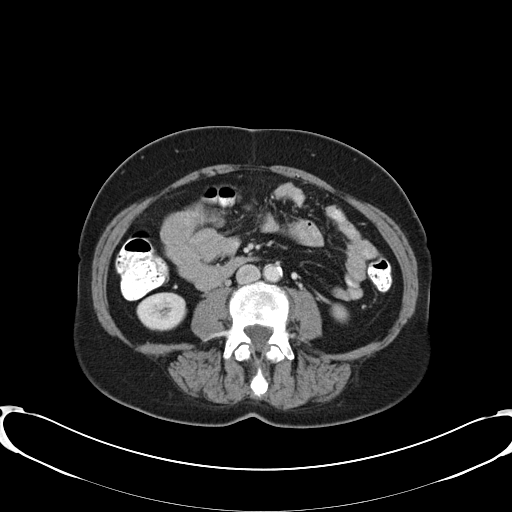
[im 61/91  soft-tissue]
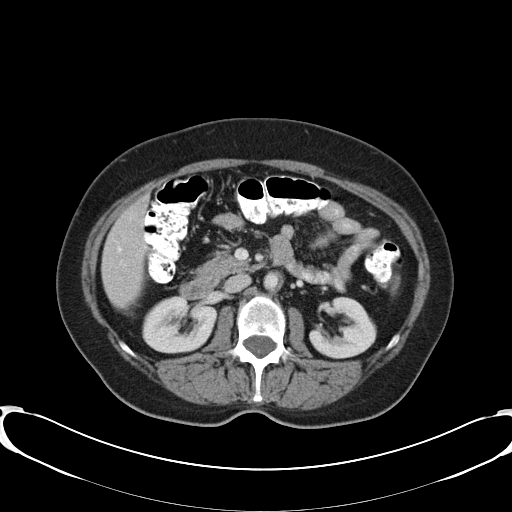
[im 61/91  bone]
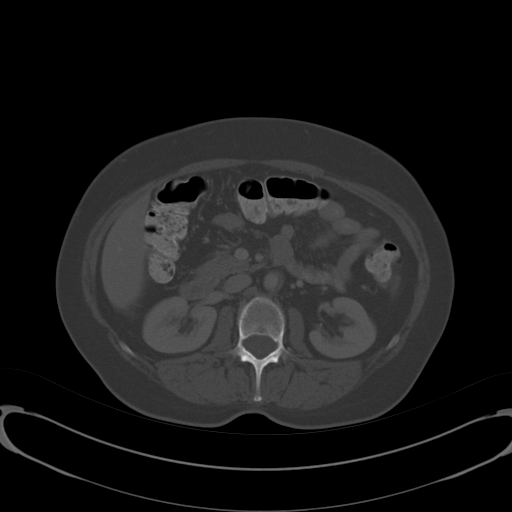
[im 66/91  soft-tissue]
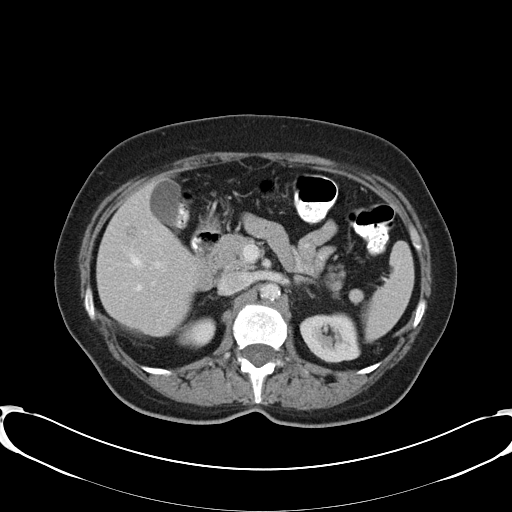
[im 71/91  soft-tissue]
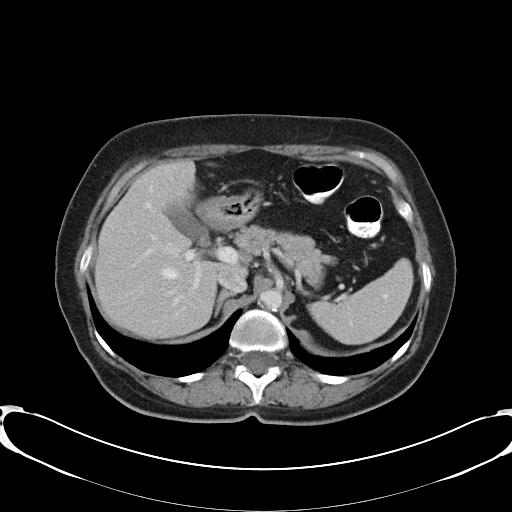
[im 81/91  soft-tissue]
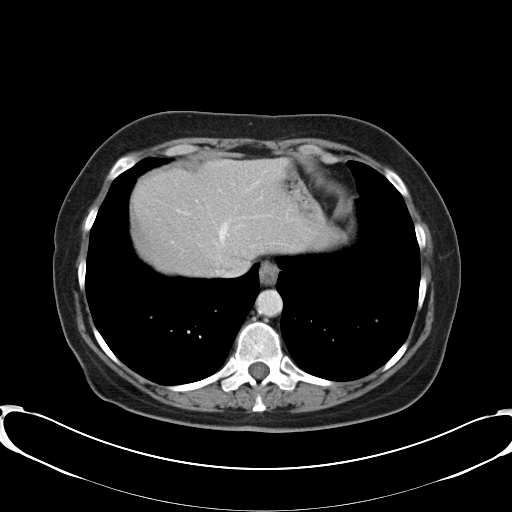
[im 86/91  soft-tissue]
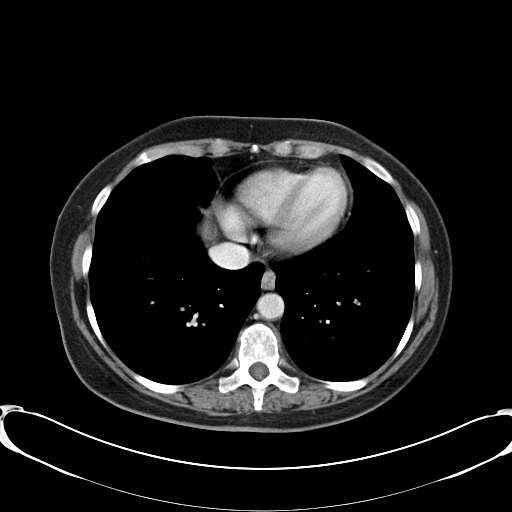

[Series 602: <mpr thick range> · coronal · 0.88mm/px · 3 of 70 slices shown]
[im 24/70  soft-tissue]
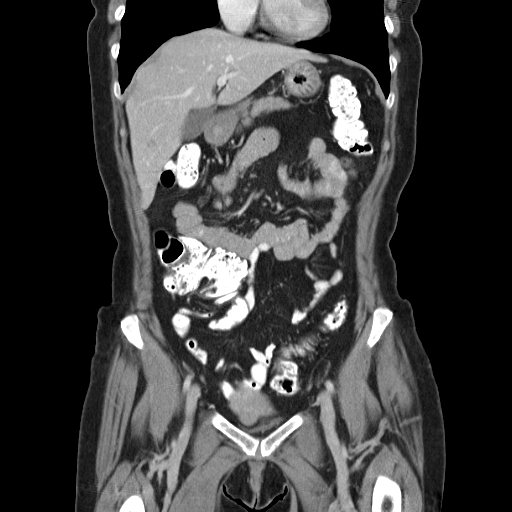
[im 31/70  soft-tissue]
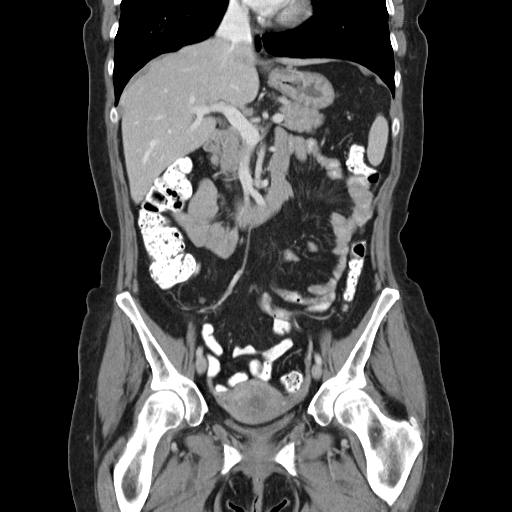
[im 39/70  soft-tissue]
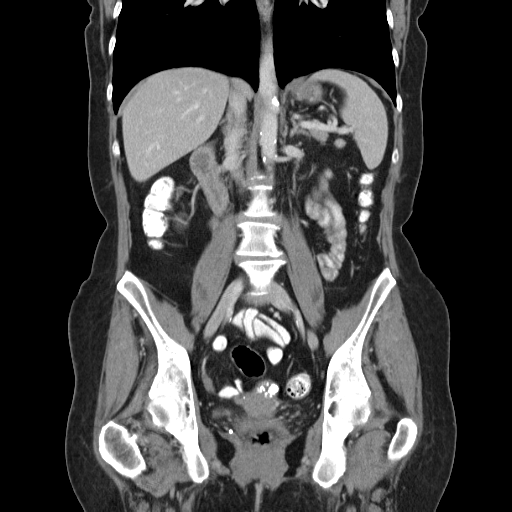

[16 of 46 positions shown; findings below may reference images not displayed]

FINDINGS: Lung bases are clear.

7 mm hypoenhancing lesion in the anterior segment right hepatic
lobe (series 2/image 26), indeterminate.  Additional tiny
hypoenhancing lesion in the medial segment left hepatic lobe
(series 2/image 19).

Spleen, pancreas, and adrenal glands are within normal limits.

Gallbladder is unremarkable.  No intrahepatic or extrahepatic
ductal dilatation.

Kidneys are within normal limits.  No hydronephrosis.

No evidence of bowel obstruction.  Normal appendix.

Atherosclerotic calcifications of the abdominal aorta and branch
vessels.

No abdominopelvic ascites.

Calcified left posterior uterine body fibroid.  Ill-defined mass in
the right uterine fundus, measuring approximately 2.9 x 3.5 x
cm, likely corresponding to the biopsy-proven endometrial
adenocarcinoma.  Deep myometrial invasion is suspected.  No
definite transgression of the serosal surface.  No involvement of
the adjacent adnexa, the cervix, or the bladder.

No suspicious retroperitoneal/abdominopelvic lymphadenopathy.

Mild degenerative changes of the visualized thoracolumbar spine.
IMPRESSION: 3.5 cm ill-defined mass in the right uterine fundus, with suspected
deep myometrial invasion.

No definite transgression of the serosal surface.  No involvement
of the adjacent adnexa, the cervix, or the bladder.  No suspicious
retroperitoneal/abdominopelvic lymphadenopathy.

7 mm hypoenhancing lesion in the anterior segment right hepatic
lobe. While this is statistically less likely to reflect metastasis
in the absence of lymphadenopathy, it remains indeterminate.  If
desired, MR abdomen with/without contrast could be performed for
further characterization.

## 2012-12-27 ENCOUNTER — Ambulatory Visit: Payer: BC Managed Care – PPO | Admitting: Radiation Oncology

## 2013-01-21 ENCOUNTER — Encounter: Payer: Self-pay | Admitting: Radiation Oncology

## 2013-01-21 ENCOUNTER — Other Ambulatory Visit (HOSPITAL_COMMUNITY)
Admission: RE | Admit: 2013-01-21 | Discharge: 2013-01-21 | Disposition: A | Discharge status: Home / Self Care | Payer: BC Managed Care – PPO | Source: Ambulatory Visit | Attending: Radiation Oncology | Admitting: Radiation Oncology

## 2013-01-21 ENCOUNTER — Ambulatory Visit
Admission: RE | Admit: 2013-01-21 | Discharge: 2013-01-21 | Disposition: A | Discharge status: Home / Self Care | Payer: BC Managed Care – PPO | Source: Ambulatory Visit | Attending: Radiation Oncology | Admitting: Radiation Oncology

## 2013-01-21 VITALS — BP 112/61 | HR 96 | Temp 97.8°F | Ht 64.0 in | Wt 129.8 lb

## 2013-01-21 DIAGNOSIS — Z01419 Encounter for gynecological examination (general) (routine) without abnormal findings: Secondary | ICD-10-CM | POA: Insufficient documentation

## 2013-01-21 DIAGNOSIS — C541 Malignant neoplasm of endometrium: Secondary | ICD-10-CM

## 2013-01-21 NOTE — Progress Notes (Signed)
  Radiation Oncology         [phone] ________________________________  Name: Tracey Oneill MRN: [id number]  Date: 01/21/2013  DOB: 03-28-1951  Follow-Up Visit Note  CC: No PCP Per Patient  Reece Packer, MD  Diagnosis:   Stage IIIc endometrial adenocarcinoma  Interval Since Last Radiation:  12  months the patient received external beam radiation therapy directed at the pelvis. The vaginal cuff was boosted further with iridium 192 as the high-dose-rate source  Narrative:  The patient returns today for routine follow-up.  She is doing well and without complaints. She continues to use her vaginal dilator. She denies any vaginal bleeding, urination difficulties. She continues to have some mild difficulties with tolerating fruits which lead to loose stools. She denies any rectal bleeding. She was seen in gynecologic oncology 3 months ago and received a good report.                              ALLERGIES:  is allergic to neosporin and codeine.  Meds: No current outpatient prescriptions on file.   No current facility-administered medications for this encounter.    Physical Findings: The patient is in no acute distress. Patient is alert and oriented.  height is 5\' 4"  (1.626 m) and weight is 129 lb 12.8 oz (58.877 kg). Her temperature is 97.8 F (36.6 C). Her blood pressure is 112/61 and her pulse is 96. Marland Kitchen No palpable supraclavicular or axillary adenopathy. The lungs are clear to auscultation. The heart has regular rhythm and rate.  The abdomen is soft and nontender with normal bowel sounds. The inguinal areas are free of adenopathy. On pelvic examination the external genitalia are unremarkable. A speculum exam is performed. There radiation changes noted in the proximal vagina. No mucosal lesions are noted. A Pap smear was obtained of the proximal vagina. On bimanual and rectovaginal examination there no pelvic masses appreciated.  Lab Findings: Lab Results  Component Value Date   WBC  9.4 10/17/2012   HGB 12.4 10/17/2012   HCT 39.1 10/17/2012   MCV 96.1 10/17/2012   PLT 241 07/11/2012      Radiographic Findings: No results found.  Impression:  The patient is recovering from the effects of radiation. No evidence of recurrence on clinical exam today, Pap smear pending  Plan:  Routine followup in 6 months. In the interim the patient will be seen by gynecologic oncology.  _____________________________________  -----------------------------------  Billie Lade, PhD, MD

## 2013-01-21 NOTE — Progress Notes (Signed)
Tracey Oneill here with her grandson for follow up after treatment for endometrial cancer.  She denies pain and fatigue.  She also denies any vaginal discharge or bleeding, bladder issues and diarrhea.  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

## 2013-01-23 ENCOUNTER — Telehealth: Payer: Self-pay | Admitting: Oncology

## 2013-01-23 NOTE — Telephone Encounter (Signed)
Called Arra Connaughton to let her know that her pap smear results were good per Dr. Roselind Messier.

## 2013-02-07 ENCOUNTER — Other Ambulatory Visit: Payer: Self-pay | Admitting: Gynecologic Oncology

## 2013-02-07 DIAGNOSIS — C541 Malignant neoplasm of endometrium: Secondary | ICD-10-CM

## 2013-02-28 ENCOUNTER — Other Ambulatory Visit (HOSPITAL_BASED_OUTPATIENT_CLINIC_OR_DEPARTMENT_OTHER): Payer: BC Managed Care – PPO | Admitting: Lab

## 2013-02-28 DIAGNOSIS — C549 Malignant neoplasm of corpus uteri, unspecified: Secondary | ICD-10-CM

## 2013-02-28 DIAGNOSIS — C541 Malignant neoplasm of endometrium: Secondary | ICD-10-CM

## 2013-02-28 LAB — BUN AND CREATININE (CC13): BUN: 21.1 mg/dL (ref 7.0–26.0)

## 2013-03-06 ENCOUNTER — Encounter (HOSPITAL_COMMUNITY): Payer: Self-pay

## 2013-03-06 ENCOUNTER — Ambulatory Visit (HOSPITAL_COMMUNITY)
Admission: RE | Admit: 2013-03-06 | Discharge: 2013-03-06 | Disposition: A | Discharge status: Home / Self Care | Payer: BC Managed Care – PPO | Source: Ambulatory Visit | Attending: Gynecologic Oncology | Admitting: Gynecologic Oncology

## 2013-03-06 DIAGNOSIS — C549 Malignant neoplasm of corpus uteri, unspecified: Secondary | ICD-10-CM | POA: Insufficient documentation

## 2013-03-06 DIAGNOSIS — Z90710 Acquired absence of both cervix and uterus: Secondary | ICD-10-CM | POA: Insufficient documentation

## 2013-03-06 DIAGNOSIS — Z9079 Acquired absence of other genital organ(s): Secondary | ICD-10-CM | POA: Insufficient documentation

## 2013-03-06 DIAGNOSIS — I700 Atherosclerosis of aorta: Secondary | ICD-10-CM | POA: Insufficient documentation

## 2013-03-06 DIAGNOSIS — K7689 Other specified diseases of liver: Secondary | ICD-10-CM | POA: Insufficient documentation

## 2013-03-06 DIAGNOSIS — Z9221 Personal history of antineoplastic chemotherapy: Secondary | ICD-10-CM | POA: Insufficient documentation

## 2013-03-06 DIAGNOSIS — C541 Malignant neoplasm of endometrium: Secondary | ICD-10-CM

## 2013-03-06 MED ORDER — IOHEXOL 300 MG/ML  SOLN
100.0000 mL | Freq: Once | INTRAMUSCULAR | Status: AC | PRN
  Administered 2013-03-06: 100 mL via INTRAVENOUS

## 2013-03-12 ENCOUNTER — Encounter: Payer: Self-pay | Admitting: Gynecologic Oncology

## 2013-03-12 ENCOUNTER — Telehealth: Payer: Self-pay | Admitting: Gynecologic Oncology

## 2013-03-12 NOTE — Telephone Encounter (Signed)
Attempted to call patient with CT scan results.  Letter with results will be mailed.

## 2013-05-09 ENCOUNTER — Telehealth: Payer: Self-pay

## 2013-05-09 NOTE — Telephone Encounter (Signed)
Returned patient's call to answer question regarding dilator.Left message to return call  To either myself or Tracey Oneill.

## 2013-07-15 ENCOUNTER — Ambulatory Visit: Payer: BC Managed Care – PPO | Admitting: Radiation Oncology

## 2013-07-18 ENCOUNTER — Encounter: Payer: Self-pay | Admitting: Radiation Oncology

## 2013-07-18 ENCOUNTER — Ambulatory Visit
Admission: RE | Admit: 2013-07-18 | Discharge: 2013-07-18 | Disposition: A | Discharge status: Home / Self Care | Payer: BC Managed Care – PPO | Source: Ambulatory Visit | Attending: Radiation Oncology | Admitting: Radiation Oncology

## 2013-07-18 ENCOUNTER — Other Ambulatory Visit (HOSPITAL_COMMUNITY)
Admission: RE | Admit: 2013-07-18 | Discharge: 2013-07-18 | Disposition: A | Discharge status: Home / Self Care | Payer: BC Managed Care – PPO | Source: Ambulatory Visit | Attending: Radiation Oncology | Admitting: Radiation Oncology

## 2013-07-18 VITALS — BP 126/65 | HR 89 | Temp 98.3°F | Ht 64.0 in | Wt 126.7 lb

## 2013-07-18 DIAGNOSIS — C541 Malignant neoplasm of endometrium: Secondary | ICD-10-CM

## 2013-07-18 DIAGNOSIS — Z01419 Encounter for gynecological examination (general) (routine) without abnormal findings: Secondary | ICD-10-CM | POA: Insufficient documentation

## 2013-07-18 NOTE — Progress Notes (Signed)
  Radiation Oncology         [phone] ________________________________  Name: Tracey Oneill MRN: [id number]  Date: 07/18/2013  DOB: January 11, 1951  Follow-Up Visit Note  CC: No PCP Per Patient  [name], MD  Diagnosis:   Stage III-c endometrial adenocarcinoma   Interval Since Last Radiation:  18  months, the patient received external beam radiation therapy directed at the pelvis. The vaginal cuff was boosted further with iridium 192 as the high-dose-rate source   Narrative:  The patient returns today for routine follow-up.  She is doing well at this time. she denies any pelvic pain urination difficulties or bowel complaints. She denies any rectal bleeding or hematuria. She occasionally will notice a scant amount of blood after using her vaginal dilator. This occurs when she is inconsistent with its use.  She missed her followup appointment with gynecologic oncology. She did undergo imaging in December showing no evidence of recurrence in the abdomen or pelvis.                              ALLERGIES:  is allergic to neosporin and codeine.  Meds: No current outpatient prescriptions on file.   No current facility-administered medications for this encounter.    Physical Findings: The patient is in no acute distress. Patient is alert and oriented.  height is 5\' 4"  (1.626 m) and weight is 126 lb 11.2 oz (57.471 kg). Her temperature is 98.3 F (36.8 C). Her blood pressure is 126/65 and her pulse is 89. Marland Kitchen  No palpable supraclavicular or axillary adenopathy. The lungs are clear to auscultation. The heart has a regular rhythm and rate. The abdomen is soft and nontender with normal bowel sounds. The inguinal areas are free of adenopathy. On pelvic examination the external genitalia are unremarkable. A speculum exam is performed. There are no mucosal lesions noted. There are mild radiation changes noted in the proximal vagina. A Pap smear was obtained of the vaginal cuff region. On bimanual  and rectovaginal examination there no pelvic masses appreciated.  Lab Findings: Lab Results  Component Value Date   WBC 9.4 10/17/2012   HGB 12.4 10/17/2012   HCT 39.1 10/17/2012   MCV 96.1 10/17/2012   PLT 241 07/11/2012      Radiographic Findings: No results found.  Impression:  No evidence of recurrence on clinical exam today, Pap smear pending  Plan:  Routine followup in 6 months. In the interim the patient will be seen by gynecologic oncology.  ____________________________________ Blair Promise, MD

## 2013-07-18 NOTE — Progress Notes (Signed)
Tracey Oneill here for follow up after treatment for endometrial cancer.  She denies pain.  She does have tingling in her feet from neuropathy.  She denies vaginal/rectal bleeding, abdominal cramping, urinary issues and diarrhea.  She has lost 5 lbs since 01/21/13 and says she has stopped eating junk food and is not as hungry.

## 2013-07-23 ENCOUNTER — Telehealth: Payer: Self-pay | Admitting: Oncology

## 2013-07-23 NOTE — Telephone Encounter (Signed)
Called Tracey Oneill and let her know that her pap smear results were good per Dr. Sondra Come.  Tracey Oneill verbalized agreement.

## 2013-07-25 ENCOUNTER — Telehealth: Payer: Self-pay | Admitting: *Deleted

## 2013-07-25 NOTE — Telephone Encounter (Signed)
Notified pt of results 

## 2013-07-25 NOTE — Telephone Encounter (Signed)
Message copied by GARNER, Aletha Halim on Thu Jul 25, 2013  3:36 PM ------      Message from: CROSS, MELISSA D      Created: Wed Jul 24, 2013  8:47 AM       Please let her know that her pap smear is normal.  Thanks!             ----- Message -----         From: Lab in [facility] Interface         Sent: 07/23/2013   2:04 PM           To: Dorothyann Gibbs, NP                   ------

## 2014-01-16 ENCOUNTER — Ambulatory Visit: Payer: BC Managed Care – PPO | Admitting: Radiation Oncology

## 2014-01-27 ENCOUNTER — Encounter: Payer: Self-pay | Admitting: Radiation Oncology

## 2014-01-27 ENCOUNTER — Inpatient Hospital Stay
Admission: RE | Admit: 2014-01-27 | Discharge: 2014-01-27 | Disposition: A | Discharge status: Home / Self Care | Payer: BC Managed Care – PPO | Source: Ambulatory Visit | Attending: Radiation Oncology | Admitting: Radiation Oncology

## 2014-01-27 ENCOUNTER — Other Ambulatory Visit (HOSPITAL_COMMUNITY)
Admission: RE | Admit: 2014-01-27 | Discharge: 2014-01-27 | Disposition: A | Discharge status: Home / Self Care | Payer: BC Managed Care – PPO | Source: Ambulatory Visit | Attending: Radiation Oncology | Admitting: Radiation Oncology

## 2014-01-27 VITALS — BP 129/71 | HR 112 | Temp 98.0°F | Resp 16 | Ht 64.0 in | Wt 126.1 lb

## 2014-01-27 DIAGNOSIS — Z01411 Encounter for gynecological examination (general) (routine) with abnormal findings: Secondary | ICD-10-CM | POA: Insufficient documentation

## 2014-01-27 DIAGNOSIS — C541 Malignant neoplasm of endometrium: Secondary | ICD-10-CM

## 2014-01-27 NOTE — Progress Notes (Signed)
  Radiation Oncology         [phone] ________________________________  Name: Tracey Oneill MRN: [id number]  Date: 01/27/2014  DOB: Jun 11, 1950  Follow-Up Visit Note  CC: No PCP Per Patient  [name], MD    ICD-9-CM ICD-10-CM   1. Endometrial carcinoma 182.0 C54.1 Cytology - PAP    Diagnosis:   Stage III-C Endometrial adenocarcinoma  Interval Since Last Radiation:  2 years and one month    Narrative:  The patient returns today for routine follow-up.  She is doing well. She does have some peripheral neuropathy in her lower extremities but not wish to pursue evaluation or treatment of this issue. 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  She denies any pain in the pelvis area vaginal bleeding or rectal bleeding. She continues to use her vaginal dilator on a regular basis. The patient has not been following in gynecologic oncology                              ALLERGIES:  is allergic to neosporin and codeine.  Meds: No current outpatient prescriptions on file.   No current facility-administered medications for this encounter.    Physical Findings: The patient is in no acute distress. Patient is alert and oriented.  height is 5\' 4"  (1.626 m) and weight is 126 lb 1.6 oz (57.199 kg). Her oral temperature is 98 F (36.7 C). Her blood pressure is 129/71 and her pulse is 112. Her respiration is 16. Marland Kitchen  No palpable cervical supraclavicular or axillary adenopathy. Lungs are clear to auscultation. The heart has regular rhythm and rate. The abdomen is soft and non-tender with normal bowel sounds. No hepatosplenomegaly. The inguinal areas are free of adenopathy. A pelvic examination the external genitalia are unremarkable. A speculum exam is performed. There is some radiation changes noted in the proximal vagina. No mucosal lesions noted. Pap smear was obtained of the proximal vagina. On bimanual and rectovaginal examination there no pelvic masses appreciated.  Lab  Findings: Lab Results  Component Value Date   WBC 9.4 10/17/2012   HGB 12.4 10/17/2012   HCT 39.1 10/17/2012   MCV 96.1 10/17/2012   PLT 241 07/11/2012    Radiographic Findings: No results found.  Impression:  No evidence of recurrence on clinical exam today, Pap smear pending  Plan:  Routine followup in 4 months. I've encouraged patient to follow-up with gynecologic oncology.  ____________________________________ Blair Promise, MD

## 2014-01-27 NOTE — Progress Notes (Signed)
Tracey Oneill here for follow up after treatment for endometrial cancer.  She denies pain, bladder issues, diarrhea/constipation, nausea and vaginal/rectal bleeding.  She reports neuropathy in her legs that makes her tired. She is working 12 hour shifts 2-3 times per week.  She reports using her vaginal dilator regularly.

## 2014-01-29 LAB — CYTOLOGY - PAP

## 2014-01-31 ENCOUNTER — Telehealth: Payer: Self-pay | Admitting: Oncology

## 2014-01-31 NOTE — Telephone Encounter (Signed)
Left a message about her good pap smear results.  Requested a return call.

## 2014-04-02 ENCOUNTER — Telehealth: Payer: Self-pay | Admitting: Oncology

## 2014-04-02 NOTE — Telephone Encounter (Signed)
Tracey Oneill called and said she had a small amount ("a smear") of vaginal bleeding yesterday.  She said the blood was not bright red but more of a pink color.  She denies any cramping, illness or fever.  She said this is the first time she has had any bleeding and is wondering if it is OK.

## 2014-04-02 NOTE — Telephone Encounter (Signed)
Called [name] back and advised her to call us if she has any more episodes of bleeding per Dr. Sondra Come.  [name] verbalized agreement.

## 2014-05-14 ENCOUNTER — Telehealth: Payer: Self-pay | Admitting: Oncology

## 2014-05-14 NOTE — Telephone Encounter (Signed)
Called [name] back and advised her that the bleeding was from the dilator.  Advised her to call us if she has any more bleeding and we will move up her follow up appointment.  [name] verbalized agreement.

## 2014-05-14 NOTE — Telephone Encounter (Signed)
[name] called and said she had a small amount (about a tablespoon) of pink vaginal bleeding this morning.  She said she used her vaginal dilator last night and is wondering if she irritated the area and caused the bleeding.  She denies having cramps or fever.  She is wondering if she needs to see Dr. Sondra Come sooner than her scheduled follow up 06/05/14.  Advised her that Dr. Sondra Come will be notified and she will be called back.

## 2014-06-05 ENCOUNTER — Encounter: Payer: Self-pay | Admitting: Radiation Oncology

## 2014-06-05 ENCOUNTER — Other Ambulatory Visit (HOSPITAL_COMMUNITY)
Admission: RE | Admit: 2014-06-05 | Discharge: 2014-06-05 | Disposition: A | Discharge status: Home / Self Care | Payer: BLUE CROSS/BLUE SHIELD | Source: Ambulatory Visit | Attending: Radiation Oncology | Admitting: Radiation Oncology

## 2014-06-05 ENCOUNTER — Ambulatory Visit
Admission: RE | Admit: 2014-06-05 | Discharge: 2014-06-05 | Disposition: A | Discharge status: Home / Self Care | Payer: BLUE CROSS/BLUE SHIELD | Source: Ambulatory Visit | Attending: Radiation Oncology | Admitting: Radiation Oncology

## 2014-06-05 VITALS — BP 119/70 | HR 84 | Temp 97.7°F | Resp 16 | Ht 64.0 in | Wt 122.8 lb

## 2014-06-05 DIAGNOSIS — C541 Malignant neoplasm of endometrium: Secondary | ICD-10-CM

## 2014-06-05 DIAGNOSIS — Z01411 Encounter for gynecological examination (general) (routine) with abnormal findings: Secondary | ICD-10-CM | POA: Diagnosis not present

## 2014-06-05 NOTE — Progress Notes (Signed)
  Radiation Oncology         [phone] ________________________________  Name: Tracey Oneill MRN: [id number]  Date: 06/05/2014  DOB: 20-Jul-1950  Follow-Up Visit Note  CC: No PCP Per Patient  [name], MD    ICD-9-CM ICD-10-CM   1. Endometrial carcinoma 182.0 C54.1     Diagnosis:   Stage III-C endometrial adenocarcinoma  Interval Since Last Radiation:  2 years and 5 months   Narrative:  The patient returns today for routine follow-up.  She is doing well at this time. She did have some vaginal bleeding related to her vaginal dilator insertion. The patient feels she was pushing this too far. She has had no further bleeding over the past couple weeks. She denies any pain in the pelvis area hematuria or rectal bleeding or bowel complaints. She denies any flank or low back pain. Patient's energy level is good her appetite is good. She continues to work proximally 3 days a week at the [employer]:  is allergic to neosporin and codeine.  Meds: No current outpatient prescriptions on file.   No current facility-administered medications for this encounter.    Physical Findings: The patient is in no acute distress. Patient is alert and oriented.  height is 5\' 4"  (1.626 m) and weight is 122 lb 12.8 oz (55.702 kg). Her oral temperature is 97.7 F (36.5 C). Her blood pressure is 119/70 and her pulse is 84. Her respiration is 16. Marland Kitchen  No palpable supraclavicular or axillary adenopathy. The lungs are clear to auscultation. The heart has a regular rhythm and rate. Examination of the left breast reveals no mass or nipple discharge. Examination of the right breast reveals no mass or nipple discharge. The abdomen is soft and nontender with normal bowel sounds. No hepatosplenomegaly. The inguinal areas are free of adenopathy. On pelvic examination the external genitalia are unremarkable. A speculum exam is performed. There is some radiation changes noted  in the proximal vagina but no mucosal lesions noted. A Pap smear was obtained of the proximal vagina. On bimanual and rectovaginal examination there no pelvic masses appreciated.  Lab Findings: Lab Results  Component Value Date   WBC 9.4 10/17/2012   HGB 12.4 10/17/2012   HCT 39.1 10/17/2012   MCV 96.1 10/17/2012   PLT 241 07/11/2012    Radiographic Findings: No results found.  Impression:  No evidence for recurrence on clinical exam today, Pap smear pending  Plan:  Routine follow-up in 4 months as the patient is not following up with gynecologic oncology  ____________________________________ Blair Promise, MD

## 2014-06-05 NOTE — Progress Notes (Signed)
Tracey Oneill here for follow up.  She denies pain.  She had not had any more vaginal bleeding.  She thinks the past episodes of bleeding was from forcing her dilator too much.  She denies bowel and bladder issues.  She reports a good appetite.  She has lost 4 lbs but is trying to keep her weight down.  She reports she had an upper respiratory infection recently and still has a dry cough.  She denies fatigue.  BP 119/70 mmHg  Pulse 84  Temp(Src) 97.7 F (36.5 C) (Oral)  Resp 16  Ht 5\' 4"  (1.626 m)  Wt 122 lb 12.8 oz (55.702 kg)  BMI 21.07 kg/m2

## 2014-06-06 LAB — CYTOLOGY - PAP

## 2014-06-11 ENCOUNTER — Telehealth: Payer: Self-pay | Admitting: Oncology

## 2014-06-11 NOTE — Telephone Encounter (Signed)
Left a message for [name] regarding her good pap smear results per Dr. Sondra Come.  Requested a return call.

## 2014-06-11 NOTE — Telephone Encounter (Signed)
Tracey Oneill called back and said she had received my message about her good pap smear results.

## 2014-10-09 ENCOUNTER — Ambulatory Visit: Payer: BLUE CROSS/BLUE SHIELD | Admitting: Radiation Oncology

## 2014-10-15 ENCOUNTER — Ambulatory Visit
Admission: RE | Admit: 2014-10-15 | Discharge: 2014-10-15 | Disposition: A | Discharge status: Home / Self Care | Payer: BLUE CROSS/BLUE SHIELD | Source: Ambulatory Visit | Attending: Radiation Oncology | Admitting: Radiation Oncology

## 2014-11-20 ENCOUNTER — Encounter: Payer: Self-pay | Admitting: Radiation Oncology

## 2014-11-20 ENCOUNTER — Ambulatory Visit
Admission: RE | Admit: 2014-11-20 | Discharge: 2014-11-20 | Disposition: A | Discharge status: Home / Self Care | Payer: BLUE CROSS/BLUE SHIELD | Source: Ambulatory Visit | Attending: Radiation Oncology | Admitting: Radiation Oncology

## 2014-11-20 ENCOUNTER — Other Ambulatory Visit (HOSPITAL_COMMUNITY)
Admission: RE | Admit: 2014-11-20 | Discharge: 2014-11-20 | Disposition: A | Discharge status: Home / Self Care | Payer: BLUE CROSS/BLUE SHIELD | Source: Ambulatory Visit | Attending: Radiation Oncology | Admitting: Radiation Oncology

## 2014-11-20 VITALS — BP 133/62 | HR 88 | Temp 97.7°F | Resp 12 | Ht 64.0 in | Wt 126.7 lb

## 2014-11-20 DIAGNOSIS — Z124 Encounter for screening for malignant neoplasm of cervix: Secondary | ICD-10-CM | POA: Insufficient documentation

## 2014-11-20 DIAGNOSIS — C541 Malignant neoplasm of endometrium: Secondary | ICD-10-CM

## 2014-11-20 NOTE — Progress Notes (Signed)
Tracey Oneill here for follow up.  She denies pain, nausea, bowel and bladder issues and vaginal/rectal bleeding.  She reports fatigue.  She reports having neuropathy in her left foot and a callus on the bottom of her right foot which is keeping her from working a lot.  She is using her vaginal dilator.  BP 133/62 mmHg  Pulse 88  Temp(Src) 97.7 F (36.5 C) (Oral)  Resp 12  Ht 5\' 4"  (1.626 m)  Wt 126 lb 11.2 oz (57.471 kg)  BMI 21.74 kg/m2

## 2014-11-20 NOTE — Progress Notes (Signed)
Radiation Oncology         [phone]) [phone] ________________________________  Name: Tracey Oneill MRN: [id number]  Date: 11/20/2014  DOB: 02-26-1951  Follow-Up Visit Note  CC: No PCP Per Patient  [name], MD    ICD-9-CM ICD-10-CM   1. Endometrial carcinoma 182.0 C54.1     Diagnosis: Tracey Oneill is a 64 year old female presenting to clinic in regards to her Stage III-C endometrial adenocarcinoma.  Interval Since Last Radiation:  2 years and 10  months  Narrative:  The patient returns today for routine follow-up with radiation oncology. She denies symptoms of pain, nausea, bowel or bladder issues and vaginal/rectal bleeding. She reports not bleeding when she uses her vaginal dilator. She also denies back or pelvic pain. However, the patient does confirm having neuropathy in her left foot and a callus on the bottom of her right foot. Due to these symptoms, the patient was only able to work five days this month. "Twelve hours is a long shift and I'm old," she stated, frustrated with her current situation of employment and symptoms of daily fatigue. She projected a healthy mental status and was accompanied by her grand son's girlfriend. She stated that she does not have a current primary care physician.                   ALLERGIES:  is allergic to neosporin and codeine.  Meds: No current outpatient prescriptions on file.   No current facility-administered medications for this encounter.    Physical Findings: The patient is in no acute distress and is alert and oriented. No significant changes in the status of the patient's overall health is noted.  height is 5\' 4"  (1.626 m) and weight is 126 lb 11.2 oz (57.471 kg). Her oral temperature is 97.7 F (36.5 C). Her blood pressure is 133/62 and her pulse is 88. Her respiration is 12.  The patient's lungs are clear, heart has regular rate and rhythm, with no palpable cervical, supraclavicular, or axillary adenopathy. The abdomen is soft  and nontender with normal bowel sounds. No inguinal adenopathy. On pelvic examination the external genitalia are unremarkable. A speculum exam is performed. There are mild radiation changes noted in the proximal vagina. No mucosal lesions noted. On bimanual and rectovaginal examination there no pelvic masses appreciated. Pap smears obtained of the proximal vagina.  Breasts: No palpable masses, nipple discharge, or bleeding to be noted.    Lab Findings: Lab Results  Component Value Date   WBC 9.4 10/17/2012   HGB 12.4 10/17/2012   HCT 39.1 10/17/2012   MCV 96.1 10/17/2012   PLT 241 07/11/2012    Radiographic Findings: No results found.  Impression: Tracey Oneill is a 64 year old female presenting to clinic in regards to her Stage III-C endometrial adenocarcinoma. The patient is recovering from the effects of radiation. No evidence of reoccurrence of disease was observed. A pelvic exam and pap smear were completed at today's radiation oncology appointment.  Plan: The patient was instructed to seek a primary care physican and partake in regular mammograms to help achieve optimal health, moving forward. The patient was instructed to continue use of her provided vaginal dialator. All questions and concerns vocalized have been fully addressed. The patient has been advised of her follow up appointment with radiation oncology (6 months). If she develops any further questions or concerns in regards to her treatment, she has been encouraged to contact Dr. Sondra Come, MD, PhD.  This document serves as  a record of services personally performed by Gery Pray, MD. It was created on his behalf by Lenn Cal, a trained medical scribe. The creation of this record is based on the scribe's personal observations and the provider's statements to them. This document has been checked and approved by the attending provider.    ____________________________________  Blair Promise, PhD, MD

## 2014-11-21 ENCOUNTER — Telehealth: Payer: Self-pay | Admitting: *Deleted

## 2014-11-21 LAB — CYTOLOGY - PAP

## 2014-11-21 NOTE — Telephone Encounter (Signed)
Called patient to inform of mammogram on 11-28-14 - arrival time - 11:30 am @ [facility], spoke with patient and she is aware of this appt.

## 2014-11-24 ENCOUNTER — Telehealth: Payer: Self-pay | Admitting: Oncology

## 2014-11-24 NOTE — Telephone Encounter (Signed)
Left a message for [name] advising her of the good results on her pap smear per Dr. Sondra Come.

## 2015-05-21 ENCOUNTER — Encounter: Payer: Self-pay | Admitting: Radiation Oncology

## 2015-05-21 ENCOUNTER — Ambulatory Visit
Admission: RE | Admit: 2015-05-21 | Discharge: 2015-05-21 | Disposition: A | Discharge status: Home / Self Care | Payer: BLUE CROSS/BLUE SHIELD | Source: Ambulatory Visit | Attending: Radiation Oncology | Admitting: Radiation Oncology

## 2015-05-21 VITALS — BP 121/58 | HR 82 | Temp 97.8°F | Resp 16 | Ht 64.0 in | Wt 127.4 lb

## 2015-05-21 DIAGNOSIS — C541 Malignant neoplasm of endometrium: Secondary | ICD-10-CM

## 2015-05-21 NOTE — Progress Notes (Signed)
Tracey Oneill here for follow up.  She denies having pain, bladder/bowel issues, rectal/vaginal bleeding or discharge.  She reports trouble staying asleep at night.  She reports that she is using her vaginal dilator.  BP 121/58 mmHg  Pulse 82  Temp(Src) 97.8 F (36.6 C) (Oral)  Resp 16  Ht 5\' 4"  (1.626 m)  Wt 127 lb 6.4 oz (57.788 kg)  BMI 21.86 kg/m2

## 2015-05-21 NOTE — Progress Notes (Signed)
  Radiation Oncology         [phone] ________________________________  Name: Tracey Oneill MRN: [id number]  Date: 05/21/2015  DOB: 23-Mar-1951  Follow-Up Visit Note  CC: No PCP Per Patient  [name], MD   Diagnosis: Tracey Oneill is a 65 y.o. female presenting to clinic in regards to her Stage III-C endometrial adenocarcinoma.  Interval Since Last Radiation:  3 years and 4  months.  External beam: August 22 through December 22, 2011 Intracavitary brachytherapy treatments: October 3 of October 7 of January 05, 2012  Site/dose:   Whole pelvis 4500 cGy in 25 fractions,  the vaginal cuff area received a cumulative brachytherapy dose of 1800 cGy  Narrative:  The patient returns today for routine follow-up with radiation oncology. She denies having pain, bladder/bowel issues, rectal/vaginal bleeding or discharge, or abdominal cramping. She reports trouble staying asleep at night. She reports that she is using her vaginal dilator. Her last mammogram was in September 2016 and no malignancy was identified. Pap smear in August 2016 was negative for lesions or malignancy.  ALLERGIES:  is allergic to neosporin and codeine.  Meds: No current outpatient prescriptions on file.   No current facility-administered medications for this encounter.    Physical Findings: The patient is in no acute distress and is alert and oriented. No significant changes in the status of the patient's overall health is noted.  height is 5\' 4"  (1.626 m) and weight is 127 lb 6.4 oz (57.788 kg). Her oral temperature is 97.8 F (36.6 C). Her blood pressure is 121/58 and her pulse is 82. Her respiration is 16.  The patient's lungs are clear, heart has regular rate and rhythm, with no palpable cervical, supraclavicular, or axillary adenopathy. The abdomen is soft and nontender with normal bowel sounds. No inguinal adenopathy. On pelvic examination the external genitalia are unremarkable. A speculum exam is  performed. There are mild radiation changes noted in the proximal vagina. No mucosal lesions noted. On bimanual and rectovaginal examination there are no pelvic masses appreciated.   Lab Findings: Lab Results  Component Value Date   WBC 9.4 10/17/2012   HGB 12.4 10/17/2012   HCT 39.1 10/17/2012   MCV 96.1 10/17/2012   PLT 241 07/11/2012    Radiographic Findings: No results found.  Impression: Tracey Oneill is a 65 y.o. female presenting to clinic in regards to her Stage III-C endometrial adenocarcinoma. A pelvic exam was conducted at today's radiation oncology appointment. No evidence of reoccurrence of disease was observed.  Plan: The patient was instructed to continue use of her provided vaginal dialator. All questions and concerns vocalized have been fully addressed. The patient has been advised of her follow up appointment with radiation oncology (6 months). If she develops any further questions or concerns in regards to her treatment, she has been encouraged to contact me. ____________________________________  Blair Promise, PhD, MD  This document serves as a record of services personally performed by Gery Pray, MD. It was created on his behalf by Darcus Austin, a trained medical scribe. The creation of this record is based on the scribe's personal observations and the provider's statements to them. This document has been checked and approved by the attending provider.

## 2015-11-19 ENCOUNTER — Other Ambulatory Visit (HOSPITAL_COMMUNITY)
Admission: RE | Admit: 2015-11-19 | Discharge: 2015-11-19 | Disposition: A | Discharge status: Home / Self Care | Payer: BLUE CROSS/BLUE SHIELD | Source: Ambulatory Visit | Attending: Radiation Oncology | Admitting: Radiation Oncology

## 2015-11-19 ENCOUNTER — Encounter: Payer: Self-pay | Admitting: Radiation Oncology

## 2015-11-19 ENCOUNTER — Ambulatory Visit
Admission: RE | Admit: 2015-11-19 | Discharge: 2015-11-19 | Disposition: A | Discharge status: Home / Self Care | Payer: BLUE CROSS/BLUE SHIELD | Source: Ambulatory Visit | Attending: Radiation Oncology | Admitting: Radiation Oncology

## 2015-11-19 VITALS — BP 125/62 | HR 77 | Temp 98.1°F | Ht 64.0 in | Wt 125.0 lb

## 2015-11-19 DIAGNOSIS — Z01411 Encounter for gynecological examination (general) (routine) with abnormal findings: Secondary | ICD-10-CM | POA: Insufficient documentation

## 2015-11-19 DIAGNOSIS — C541 Malignant neoplasm of endometrium: Secondary | ICD-10-CM | POA: Insufficient documentation

## 2015-11-19 NOTE — Addendum Note (Signed)
Encounter addended by: Jacqulyn Liner, RN on: 11/19/2015 11:06 AM<BR>    Actions taken: Charge Capture section accepted

## 2015-11-19 NOTE — Progress Notes (Signed)
  Radiation Oncology         [phone] ________________________________  Name: Tracey Oneill MRN: [id number]  Date: 11/19/2015  DOB: 1951-03-12  Follow-Up Visit Note  CC: No PCP Per Patient  Tracey Levan, MD   Diagnosis: Tracey Oneill is a 65 y.o. female presenting to clinic in regards to her Stage III-C endometrial adenocarcinoma.  Interval Since Last Radiation:  3 years and 10  months.  External beam: August 22 through December 22, 2011 Intracavitary brachytherapy treatments: October 3 of October 7 of January 05, 2012  Site/dose:   Whole pelvis 4500 cGy in 25 fractions. The vaginal cuff area received a cumulative brachytherapy dose of 1800 cGy  Narrative:  The patient returns today for routine follow-up with radiation oncology. She denies having any pain, bowel issues, or vaginal/rectal bleeding. She continues to report having urinary frequency. She reports having a good appetite and energy. She is using a vaginal dilator about once a week. The patient's last Pap smear was on 11/20/14 with no malignancy identified.  ALLERGIES:  is allergic to neosporin [neomycin-bacitracin zn-polymyx] and codeine.  Meds: No current outpatient prescriptions on file.   No current facility-administered medications for this encounter.     Physical Findings: The patient is in no acute distress and is alert and oriented. No significant changes in the status of the patient's overall health is noted.  height is 5\' 4"  (1.626 m) and weight is 125 lb (56.7 kg). Her oral temperature is 98.1 F (36.7 C). Her blood pressure is 125/62 and her pulse is 77. Her oxygen saturation is 100%.  The patient's lungs are clear, heart has regular rate and rhythm, with no palpable cervical, supraclavicular, or axillary adenopathy. The abdomen is soft and nontender with normal bowel sounds. No inguinal adenopathy.  On pelvic examination the external genitalia were unremarkable. A speculum exam was performed. There are  no mucosal lesions noted in the vaginal vault. A Pap smear WAS obtained of the proximal vagina. On bimanual and rectovaginal examination there were no pelvic masses appreciated.  Lab Findings: Lab Results  Component Value Date   WBC 9.4 10/17/2012   HGB 12.4 10/17/2012   HCT 39.1 10/17/2012   MCV 96.1 10/17/2012   PLT 241 07/11/2012    Radiographic Findings: No results found.  Impression: Tracey Oneill is a 65 y.o. female presenting to the clinic in regards to her Stage III-C endometrial adenocarcinoma. No evidence of reoccurrence of disease was observed on clinical exam. Pap smear pending.  Plan: The patient was advised that since she is almost 4 years out from radiation treatment, she could discontinue use of the vaginal cylinder. The patient will follow up with radiation oncology in 6 months. ____________________________________  Blair Promise, PhD, MD  This document serves as a record of services personally performed by Gery Pray, MD. It was created on his behalf by Darcus Austin, a trained medical scribe. The creation of this record is based on the scribe's personal observations and the provider's statements to them. This document has been checked and approved by the attending provider.

## 2015-11-19 NOTE — Progress Notes (Signed)
Tracey Oneill here for follow up.  She denies having any pain.  She continues to report having urinary frequency.  She denies having any bowel issues or vaginal/rectal bleeding.  She reports having a good appetite and energy.  She is using a vaginal dilator.  BP 125/62 (BP Location: Right Arm, Patient Position: Sitting)   Pulse 77   Temp 98.1 F (36.7 C) (Oral)   Ht 5\' 4"  (1.626 m)   Wt 125 lb (56.7 kg)   SpO2 100%   BMI 21.46 kg/m    Wt Readings from Last 3 Encounters:  11/19/15 125 lb (56.7 kg)  05/21/15 127 lb 6.4 oz (57.8 kg)  11/20/14 126 lb 11.2 oz (57.5 kg)

## 2015-11-23 LAB — CYTOLOGY - PAP

## 2015-11-26 ENCOUNTER — Telehealth: Payer: Self-pay | Admitting: Oncology

## 2015-11-26 NOTE — Telephone Encounter (Signed)
Left a message advising [name] that her pap smear was good per Dr. Sondra Come.

## 2016-04-15 ENCOUNTER — Other Ambulatory Visit: Payer: Self-pay | Admitting: Nurse Practitioner

## 2016-05-19 ENCOUNTER — Ambulatory Visit: Payer: BLUE CROSS/BLUE SHIELD | Admitting: Radiation Oncology

## 2016-06-13 ENCOUNTER — Ambulatory Visit
Admission: RE | Admit: 2016-06-13 | Discharge: 2016-06-13 | Disposition: A | Discharge status: Home / Self Care | Payer: BLUE CROSS/BLUE SHIELD | Source: Ambulatory Visit | Attending: Radiation Oncology | Admitting: Radiation Oncology

## 2016-06-13 ENCOUNTER — Encounter: Payer: Self-pay | Admitting: Radiation Oncology

## 2016-06-13 VITALS — BP 124/74 | HR 93 | Temp 98.4°F | Resp 18 | Wt 124.6 lb

## 2016-06-13 DIAGNOSIS — Z883 Allergy status to other anti-infective agents status: Secondary | ICD-10-CM | POA: Diagnosis not present

## 2016-06-13 DIAGNOSIS — Z885 Allergy status to narcotic agent status: Secondary | ICD-10-CM | POA: Diagnosis not present

## 2016-06-13 DIAGNOSIS — Z923 Personal history of irradiation: Secondary | ICD-10-CM | POA: Diagnosis not present

## 2016-06-13 DIAGNOSIS — C541 Malignant neoplasm of endometrium: Secondary | ICD-10-CM | POA: Diagnosis not present

## 2016-06-13 NOTE — Progress Notes (Signed)
Tracey Oneill here today for follow up following endometrial adenocarcinoma.  Patient reports only having pain to feet that is 10 out of 10.  She states that she feels that it is related to working.  Works 12 hour shifts approximately 3 days a week standing.  Denies any issues with energy or fatigue.  Denies any issues with constipation or diarrhea, reports having bowel movement every day without any issues of incontinence.  Denies any blood in urine or stool.  No complaints.  Vitals:   06/13/16 1027  BP: 124/74  Pulse: 93  Resp: 18  Temp: 98.4 F (36.9 C)  TempSrc: Oral  SpO2: 99%  Weight: 124 lb 9.6 oz (56.5 kg)    Wt Readings from Last 3 Encounters:  06/13/16 124 lb 9.6 oz (56.5 kg)  11/19/15 125 lb (56.7 kg)  05/21/15 127 lb 6.4 oz (57.8 kg)

## 2016-06-13 NOTE — Progress Notes (Signed)
  Radiation Oncology         [phone] ________________________________  Name: Tracey Oneill MRN: [id number]  Date: 06/13/2016  DOB: 09-02-1950  Follow-Up Visit Note  CC: No PCP Per Patient  [name], MD   Diagnosis: Tracey Oneill is a 66 y.o. female presenting to clinic in regards to her Stage III-C endometrial adenocarcinoma.  Interval Since Last Radiation:  4 years and 5  months.  External beam: August 22 through December 22, 2011 Intracavitary brachytherapy treatments: October 3 of October 7 of January 05, 2012  Site/dose:   Whole pelvis 4500 cGy in 25 fractions. The vaginal cuff area received a cumulative brachytherapy dose of 1800 cGy  Narrative:  The patient returns today for routine follow-up with radiation oncology. She reports 10/10 pain to her bilateral feet. She notes this may be related to working 12 hour standing shifts 3 days a week. She denies fatigue. She denies constipation, diarrhea, or incontinence. She denies blood in urine or stool.   ALLERGIES:  is allergic to neosporin [neomycin-bacitracin zn-polymyx] and codeine.  Meds: No current outpatient prescriptions on file.   No current facility-administered medications for this encounter.     Physical Findings: The patient is in no acute distress and is alert and oriented. No significant changes in the status of the patient's overall health is noted.  weight is 124 lb 9.6 oz (56.5 kg). Her oral temperature is 98.4 F (36.9 C). Her blood pressure is 124/74 and her pulse is 93. Her respiration is 18 and oxygen saturation is 99%.  The patient's lungs are clear, heart has regular rate and rhythm, with no palpable cervical, supraclavicular, or axillary adenopathy. The abdomen is soft and nontender with normal bowel sounds. No inguinal adenopathy.  On pelvic examination the external genitalia were unremarkable. A speculum exam was performed. There are no mucosal lesions noted in the vaginal vault. On bimanual  and rectovaginal examination there were no pelvic masses appreciated.  Lab Findings: Lab Results  Component Value Date   WBC 9.4 10/17/2012   HGB 12.4 10/17/2012   HCT 39.1 10/17/2012   MCV 96.1 10/17/2012   PLT 241 07/11/2012    Radiographic Findings: No results found.  Impression: Tracey Oneill is a 66 y.o. female presenting to the clinic in regards to her Stage III-C endometrial adenocarcinoma. No evidence of reoccurrence of disease was observed on clinical exam.  Plan:  The patient will follow up with radiation oncology in 6 months. ____________________________________  Blair Promise, PhD, MD  This document serves as a record of services personally performed by Gery Pray, MD. It was created on his behalf by Bethann Humble, a trained medical scribe. The creation of this record is based on the scribe's personal observations and the provider's statements to them. This document has been checked and approved by the attending provider.

## 2016-12-15 ENCOUNTER — Encounter: Payer: Self-pay | Admitting: Radiation Oncology

## 2016-12-15 ENCOUNTER — Ambulatory Visit
Admission: RE | Admit: 2016-12-15 | Discharge: 2016-12-15 | Disposition: A | Discharge status: Home / Self Care | Payer: BLUE CROSS/BLUE SHIELD | Source: Ambulatory Visit | Attending: Radiation Oncology | Admitting: Radiation Oncology

## 2016-12-15 DIAGNOSIS — C541 Malignant neoplasm of endometrium: Secondary | ICD-10-CM | POA: Diagnosis not present

## 2016-12-15 NOTE — Progress Notes (Signed)
Tracey Oneill is here for follow up.  She denies having pain, bowel/bladder issues or vaginal bleeding/discharge.  She also denies having fatigue.  BP 122/69 (BP Location: Left Arm, Patient Position: Sitting)   Pulse 87   Temp 97.8 F (36.6 C) (Oral)   Ht 5\' 4"  (1.626 m)   Wt 123 lb (55.8 kg)   SpO2 100%   BMI 21.11 kg/m    Wt Readings from Last 3 Encounters:  12/15/16 123 lb (55.8 kg)  06/13/16 124 lb 9.6 oz (56.5 kg)  11/19/15 125 lb (56.7 kg)

## 2016-12-15 NOTE — Progress Notes (Addendum)
  Radiation Oncology         [phone] ________________________________  Name: Tracey Oneill MRN: [id number]  Date: 12/15/2016  DOB: 10/02/1950  Follow-Up Visit Note  CC: Patient, No Pcp Per  Tracey Levan, MD    ICD-10-CM   1. Endometrial carcinoma (HCC)Chronic C54.1 Cytology - PAP    Diagnosis:   66 y.o. woman with Stage III-C endometrial adenocarcinoma.  Interval Since Last Radiation:  5 years   External beam: August 22 through December 22, 2011 Intracavitary brachytherapy treatments: October 3 of October 7 of January 05, 2012  Site/dose:   Whole pelvis 4500 cGy in 25 fractions. The vaginal cuff area received a cumulative brachytherapy dose of 1800 cGy  Narrative:  Tracey Oneill is here for follow up.  She denies having pain, bowel/bladder issues or vaginal bleeding/discharge.  She also denies having fatigue. She reported having tiredness in her feet sometimes, but is constantly being active.  Patient did undergo routine bilateral screening mammograms September 10 showing no evidence of concern.                          ALLERGIES:  is allergic to neosporin [neomycin-bacitracin zn-polymyx] and codeine.  Meds: No current outpatient prescriptions on file.   No current facility-administered medications for this encounter.     Physical Findings: The patient is in no acute distress. Patient is alert and oriented.  height is 5\' 4"  (1.626 m) and weight is 123 lb (55.8 kg). Her oral temperature is 97.8 F (36.6 C). Her blood pressure is 122/69 and her pulse is 87. Her oxygen saturation is 100%. . Examination of the left breast reveals no palpable mass nipple discharge or bleeding. Examination of the right breast reveals no palpable mass nipple discharge or bleeding  Wt Readings from Last 3 Encounters:  12/15/16 123 lb (55.8 kg)  06/13/16 124 lb 9.6 oz (56.5 kg)  11/19/15 125 lb (56.7 kg)   Pelvic exam; On pelvic examination the external genitalia were unremarkable. A speculum  exam was performed. There are no mucosal lesions noted in the vaginal vault. A Pap smear was obtained of the proximal vagina. On bimanual and rectovaginal examination there were no pelvic masses appreciated.    Lab Findings: Lab Results  Component Value Date   WBC 9.4 10/17/2012   HGB 12.4 10/17/2012   HCT 39.1 10/17/2012   MCV 96.1 10/17/2012   PLT 241 07/11/2012    Radiographic Findings: No results found.  Impression: No evidence of recurrence on clinical exam today, Pap smear pending  Plan:  Patient will return for routine follow-up in one year. I did discuss that she could return to her gynecologist for yearly exams but the patient does not have a gynecologist or primary care physician at this time.  ____________________________________  Blair Promise, PhD, MD  This document serves as a record of services personally performed by Gery Pray MD. It was created on his behalf by Delton Coombes, a trained medical scribe. The creation of this record is based on the scribe's personal observations and the provider's statements to them. This document has been checked and approved by the attending provider.

## 2016-12-16 LAB — CYTOLOGY - PAP

## 2016-12-19 ENCOUNTER — Telehealth: Payer: Self-pay | Admitting: Oncology

## 2016-12-19 NOTE — Telephone Encounter (Signed)
Called patient and advised her of her good pap smear results per Dr. Sondra Come.  She verbalized understanding and did not have any questions.

## 2017-10-25 ENCOUNTER — Other Ambulatory Visit: Payer: Self-pay

## 2017-10-25 ENCOUNTER — Encounter (HOSPITAL_COMMUNITY): Payer: Self-pay | Admitting: Emergency Medicine

## 2017-10-25 ENCOUNTER — Emergency Department (HOSPITAL_COMMUNITY)
Admission: EM | Admit: 2017-10-25 | Discharge: 2017-10-25 | Disposition: A | Discharge status: Home / Self Care | Payer: No Typology Code available for payment source | Attending: Emergency Medicine | Admitting: Emergency Medicine

## 2017-10-25 DIAGNOSIS — Z7721 Contact with and (suspected) exposure to potentially hazardous body fluids: Secondary | ICD-10-CM | POA: Diagnosis present

## 2017-10-25 DIAGNOSIS — Z8542 Personal history of malignant neoplasm of other parts of uterus: Secondary | ICD-10-CM | POA: Diagnosis not present

## 2017-10-25 MED ORDER — TOBRAMYCIN 0.3 % OP OINT
TOPICAL_OINTMENT | Freq: Once | OPHTHALMIC | Status: AC
  Administered 2017-10-25: 13:00:00 via OPHTHALMIC
  Filled 2017-10-25: qty 3.5

## 2017-10-25 NOTE — ED Triage Notes (Signed)
Patient works at [employer].  A patient with isolation precautions for unknown infection had their ventilator tubing disconnected and secretions from the tubing got in her left eye. Patient irrigated the eye immediately with 5 saline bullets. Complains of minor irritation to left eye after irrigation.

## 2017-10-25 NOTE — Discharge Instructions (Signed)
Please read and follow all provided instructions.  Your diagnoses today include:  1. Exposure to blood or body fluid     Tests performed today include:  Vital signs. See below for your results today.   Medications prescribed:   Tobrex (tobramycin) - antibiotic eye ointment  Use this medication as follows:  Apply 1/2" of the antibiotic ointment to affected eye 3 times per day while awake  Take any prescribed medications only as directed.  Home care instructions:  Follow any educational materials contained in this packet.  BE VERY CAREFUL not to take multiple medicines containing Tylenol (also called acetaminophen). Doing so can lead to an overdose which can damage your liver and cause liver failure and possibly death.   Follow-up instructions: Please follow-up with occupational health for further evaluation of your symptoms.   Return instructions:   Please return to the Emergency Department if you experience worsening symptoms.   Please return if you have any other emergent concerns.  Additional Information:  Your vital signs today were: BP (!) 144/71 (BP Location: Right Arm)    Pulse 94    Temp 98.2 F (36.8 C) (Oral)    Resp 16    SpO2 100%  If your blood pressure (BP) was elevated above 135/85 this visit, please have this repeated by your doctor within one month. --------------

## 2017-10-25 NOTE — ED Notes (Signed)
Declined W/C at D/C and was escorted to lobby by RN. 

## 2017-10-25 NOTE — ED Provider Notes (Signed)
[facility] EMERGENCY DEPARTMENT Provider Note   CSN: [id number] Arrival date & time: 10/25/17  1119     History   Chief Complaint Chief Complaint  Patient presents with  . Foreign Body in Eye    HPI Tracey Oneill is a 67 y.o. female.  Patient presents from [facility] with complaint of body fluid exposure to her left eye.  This occurred this morning.  Patient was helping to move a patient who was on a ventilator and the ventilator tubing became disconnected causing some condensation and liquid to splash into her left eye.  Patient immediately rinsed with water with some soap in it.  This caused her eye to burn.  She also used saline bullets.  She could currently complains of some mild irritation in her left eye.  No drainage or vision changes.  Patient is up-to-date on her hepatitis vaccines.  HIV and hepatitis status of the [facility] patient are not known.  Patient was sent for further evaluation.     Past Medical History:  Diagnosis Date  . Cancer [facility])    malignant neoplasm of corpus uteri except  isthmus  . History of brachytherapy 10/3,7,12/2011   vaginal intracavitary 4500 cGy 25 fx  . History of radiation therapy 11/17/11-12/22/11   endometrial ca   . Postmenopausal     Patient Active Problem List   Diagnosis Date Noted  . Endometrial carcinoma ([place]) 08/26/2011  . Pre-operative cardiovascular examination 07/19/2011  . Post-menopausal bleeding 06/01/2011    Past Surgical History:  Procedure Laterality Date  . ABDOMINAL HYSTERECTOMY    . BUNIONECTOMY  1991   LEFT  . TUBAL LIGATION       OB History    Gravida  2   Para  2   Term  2   Preterm      AB      Living  2     SAB      TAB      Ectopic      Multiple      Live Births  2            Home Medications    Prior to Admission medications   Not on File    Family History Family History  Problem Relation Age of Onset  . Cancer Sister        COLON  . Diabetes Sister         younger  . Diabetes Sister        older  . Stroke Father   . Heart attack Sister 30    Social History Social History   Tobacco Use  . Smoking status: Never Smoker  . Smokeless tobacco: Never Used  Substance Use Topics  . Alcohol use: No  . Drug use: No     Allergies   Neosporin [neomycin-bacitracin zn-polymyx] and Codeine   Review of Systems Review of Systems  Constitutional: Negative for fever.  Eyes: Positive for pain and redness. Negative for photophobia, discharge and itching.     Physical Exam Updated Vital Signs BP (!) 144/71 (BP Location: Right Arm)   Pulse 94   Temp 98.2 F (36.8 C) (Oral)   Resp 16   SpO2 100%   Physical Exam  Constitutional: She appears well-developed and well-nourished.  HENT:  Head: Normocephalic and atraumatic.  Eyes: Pupils are equal, round, and reactive to light. Lids are normal. Right conjunctiva is not injected. Right conjunctiva has no hemorrhage. Left conjunctiva is injected (Minimal). Left  conjunctiva has no hemorrhage.  Neck: Normal range of motion. Neck supple.  Pulmonary/Chest: No respiratory distress.  Neurological: She is alert.  Skin: Skin is warm and dry.  Psychiatric: She has a normal mood and affect.  Nursing note and vitals reviewed.    ED Treatments / Results  Labs (all labs ordered are listed, but only abnormal results are displayed) Labs Reviewed - No data to display  EKG None  Radiology No results found.  Procedures Procedures (including critical care time)  Medications Ordered in ED Medications  tobramycin (TOBREX) 0.3 % ophthalmic ointment (has no administration in time range)     Initial Impression / Assessment and Plan / ED Course  I have reviewed the triage vital signs and the nursing notes.  Pertinent labs & imaging results that were available during my care of the patient were reviewed by me and considered in my medical decision making (see chart for details).     Patient  seen and examined.  Patient with low risk exposure the left eye.  Vital signs reviewed and are as follows: BP (!) 144/71 (BP Location: Right Arm)   Pulse 94   Temp 98.2 F (36.8 C) (Oral)   Resp 16   SpO2 100%   Will irrigate left eye with additional saline and start patient on tobramycin ointment for prophylaxis.  She is encouraged to follow any protocols regarding body fluid exposures from [facility] and follow-up with [facility] for further evaluation and treatment as needed.  She is given a referral.  Final Clinical Impressions(s) / ED Diagnoses   Final diagnoses:  Exposure to blood or body fluid   Patient with exposure as above.  Exposure is low risk.  No indications for HIV exposure prophylaxis at this point.  Patient is up-to-date on her hepatitis vaccines and no indication for checking this at this time.  Follow-up given as above.  I would be more concerned about a bacterial conjunctivitis at this point and patient will be placed on prophylactic antibiotic ointment for this reason.  ED Discharge Orders    None       Carlisle Cater, PA-C 10/25/17 1226    Tegeler, Gwenyth Allegra, MD 10/25/17 1537

## 2017-12-18 ENCOUNTER — Encounter: Payer: Self-pay | Admitting: Radiation Oncology

## 2017-12-18 ENCOUNTER — Other Ambulatory Visit: Payer: Self-pay

## 2017-12-18 ENCOUNTER — Ambulatory Visit
Admission: RE | Admit: 2017-12-18 | Discharge: 2017-12-18 | Disposition: A | Discharge status: Home / Self Care | Payer: Medicare Other | Source: Ambulatory Visit | Attending: Radiation Oncology | Admitting: Radiation Oncology

## 2017-12-18 VITALS — BP 124/70 | HR 84 | Temp 97.6°F | Resp 18 | Ht 64.0 in | Wt 120.5 lb

## 2017-12-18 DIAGNOSIS — Z885 Allergy status to narcotic agent status: Secondary | ICD-10-CM | POA: Insufficient documentation

## 2017-12-18 DIAGNOSIS — C541 Malignant neoplasm of endometrium: Secondary | ICD-10-CM | POA: Diagnosis present

## 2017-12-18 NOTE — Progress Notes (Signed)
Pt presents today for f/u with Dr. Sondra Come. Pt is unaccompanied. Pt denies dysuria/hematuria. Pt denies vaginal discharge/bleeding. Pt denies rectal bleeding, diarrhea/constipation. Pt denies abdominal bloating, N/V.   BP 124/70 (BP Location: Left Arm, Patient Position: Sitting)   Pulse 84   Temp 97.6 F (36.4 C) (Oral)   Resp 18   Ht 5\' 4"  (1.626 m)   Wt 120 lb 8 oz (54.7 kg)   SpO2 100%   BMI 20.68 kg/m   Wt Readings from Last 3 Encounters:  12/18/17 120 lb 8 oz (54.7 kg)  12/15/16 123 lb (55.8 kg)  06/13/16 124 lb 9.6 oz (56.5 kg)   Loma Sousa, RN BSN

## 2017-12-18 NOTE — Progress Notes (Signed)
  Radiation Oncology         [phone] ________________________________  Name: Tracey Oneill MRN: [id number]  Date: 12/18/2017  DOB: 26-Oct-1950  Follow-Up Visit Note  CC: Patient, No Pcp Per  [name], MD    ICD-10-CM   1. Endometrial carcinoma (HCC) C54.1     Diagnosis:   67 y.o. woman with Stage III-C endometrial adenocarcinoma.  Interval Since Last Radiation:  5 years   External beam: August 22 through December 22, 2011 Intracavitary brachytherapy treatments: October 3 of October 7 of January 05, 2012  Site/dose:   Whole pelvis 4500 cGy in 25 fractions. The vaginal cuff area received a cumulative brachytherapy dose of 1800 cGy  Narrative:  Tracey Oneill is here for yearly routine follow up.  She reports feeling well overall.  She notes not having a PCP or GYN doc  to follow up with. She continues to receive yearly mammograms.               ALLERGIES:  is allergic to garlic; neosporin [neomycin-bacitracin zn-polymyx]; and codeine.  Meds: No current outpatient medications on file.   No current facility-administered medications for this encounter.    Review of Systems:  A 10+ POINT REVIEW OF SYSTEMS WAS OBTAINED including neurology, dermatology, psychiatry, cardiac, respiratory, lymph, extremities, GI, GU, musculoskeletal, constitutional, reproductive, HEENT. She denies hematuria or dysuria, vaginal bleeding or discharge, rectal bleeding, diarrhea or constipation, abdominal bloating, and nausea or vomiting.  Physical Findings: The patient is in no acute distress. Patient is alert and oriented.  height is 5\' 4"  (1.626 m) and weight is 120 lb 8 oz (54.7 kg). Her oral temperature is 97.6 F (36.4 C). Her blood pressure is 124/70 and her pulse is 84. Her respiration is 18 and oxygen saturation is 100%. . Lungs are clear to auscultation bilaterally. Heart has regular rate and rhythm. No palpable cervical, supraclavicular, or axillary adenopathy. Abdomen soft, non-tender, normal  bowel sounds.   Pelvic exam: On pelvic examination the external genitalia were unremarkable. A speculum exam was performed. There are no mucosal lesions noted in the vaginal vault. On bimanual and rectovaginal examination there were no pelvic masses appreciated.    Lab Findings: Lab Results  Component Value Date   WBC 9.4 10/17/2012   HGB 12.4 10/17/2012   HCT 39.1 10/17/2012   MCV 96.1 10/17/2012   PLT 241 07/11/2012    Radiographic Findings: No results found.  Impression: Stage III-C endometrial adenocarcinoma No evidence of recurrence on clinical exam today. She does not wish to pursue getting a PCP GYN physician for follow-up.  Plan:  Patient will return for routine follow-up in one year.  ____________________________________  Blair Promise, PhD, MD  This document serves as a record of services personally performed by Gery Pray, MD. It was created on his behalf by Wilburn Mylar, a trained medical scribe. The creation of this record is based on the scribe's personal observations and the provider's statements to them. This document has been checked and approved by the attending provider.

## 2017-12-22 DIAGNOSIS — Z8542 Personal history of malignant neoplasm of other parts of uterus: Secondary | ICD-10-CM | POA: Insufficient documentation

## 2018-12-17 ENCOUNTER — Ambulatory Visit
Admission: RE | Admit: 2018-12-17 | Discharge: 2018-12-17 | Disposition: A | Discharge status: Home / Self Care | Payer: Medicare Other | Source: Ambulatory Visit | Attending: Radiation Oncology | Admitting: Radiation Oncology

## 2018-12-17 ENCOUNTER — Other Ambulatory Visit: Payer: Self-pay

## 2018-12-17 ENCOUNTER — Encounter: Payer: Self-pay | Admitting: Radiation Oncology

## 2018-12-17 VITALS — BP 127/74 | HR 96 | Temp 98.0°F | Resp 18 | Wt 118.4 lb

## 2018-12-17 DIAGNOSIS — Z923 Personal history of irradiation: Secondary | ICD-10-CM | POA: Insufficient documentation

## 2018-12-17 DIAGNOSIS — C541 Malignant neoplasm of endometrium: Secondary | ICD-10-CM

## 2018-12-17 DIAGNOSIS — Z9221 Personal history of antineoplastic chemotherapy: Secondary | ICD-10-CM | POA: Diagnosis not present

## 2018-12-17 DIAGNOSIS — Z8542 Personal history of malignant neoplasm of other parts of uterus: Secondary | ICD-10-CM | POA: Insufficient documentation

## 2018-12-17 NOTE — Progress Notes (Signed)
Pt presents today for f/u with Dr. Sondra Come. Pt denies c/o pain. Pt denies dysuria/hematuria. Pt denies vaginal bleeding/discharge. Pt denies rectal bleeding, diarrhea/constipation. Pt denies abdominal bloating, N/V.   BP 127/74 (BP Location: Left Arm, Patient Position: Sitting)   Pulse 96   Temp 98 F (36.7 C) (Temporal)   Resp 18   Wt 118 lb 6 oz (53.7 kg)   SpO2 98%   BMI 20.32 kg/m   Wt Readings from Last 3 Encounters:  12/17/18 118 lb 6 oz (53.7 kg)  12/18/17 120 lb 8 oz (54.7 kg)  12/15/16 123 lb (55.8 kg)   Loma Sousa, RN BSN

## 2018-12-17 NOTE — Progress Notes (Signed)
  Radiation Oncology         [phone] ________________________________  Name: Tracey Oneill MRN: [id number]  Date: 12/17/2018  DOB: 10-27-1950  Follow-Up Visit Note  CC: Patient, No Pcp Per  [name], MD    ICD-10-CM   1. Endometrial carcinoma (HCC)  C54.1     Diagnosis:   68 y.o. woman with Stage III-C endometrial adenocarcinoma.  Interval Since Last Radiation:  6 years, 11 months   External beam: August 22 through December 22, 2011 Intracavitary brachytherapy treatments: October 3 of October 7 of January 05, 2012  Site/dose:   Whole pelvis 4500 cGy in 25 fractions. The vaginal cuff area received a cumulative brachytherapy dose of 1800 cGy  Narrative:  Tracey Oneill is here for yearly routine follow up. She does not follow up with a PCP or GYN at this time and does not wish to pursue one. She continues to receive yearly mammograms at [facility].  On review of systems, she is without complaints today. She specifically denies pain, dysuria or hematuria, vaginal discharge or bleeding, rectal bleeding, diarrhea or constipation, abdominal bloating, and nausea or vomiting.  She denies any breast pain.  She reports doing monthly self-examinations.  ALLERGIES:  is allergic to garlic; neosporin [neomycin-bacitracin zn-polymyx]; and codeine.  Meds: No current outpatient medications on file.   No current facility-administered medications for this encounter.    Physical Findings: The patient is in no acute distress. Patient is alert and oriented.  weight is 118 lb 6 oz (53.7 kg). Her temporal temperature is 98 F (36.7 C). Her blood pressure is 127/74 and her pulse is 96. Her respiration is 18 and oxygen saturation is 98%. . Lungs are clear to auscultation bilaterally. Heart has regular rate and rhythm. No palpable cervical, supraclavicular, or axillary adenopathy. Abdomen soft, non-tender, normal bowel sounds.  Examination of the left and right breast  reveals no dominant mass nipple  discharge or bleeding.  Pelvic exam: On pelvic examination the external genitalia were unremarkable. A speculum exam was performed. There are no mucosal lesions noted in the vaginal vault. On bimanual and rectovaginal examination there were no pelvic masses appreciated.    Lab Findings: Lab Results  Component Value Date   WBC 9.4 10/17/2012   HGB 12.4 10/17/2012   HCT 39.1 10/17/2012   MCV 96.1 10/17/2012   PLT 241 07/11/2012    Radiographic Findings: No results found.  Impression: Stage III-C endometrial adenocarcinoma  No evidence of recurrence on clinical exam today. She does not wish to pursue getting a PCP GYN physician for follow-up.  I again encouraged her to obtain a primary care physician to obtain routine blood work and screening procedures.  Plan:  Patient will return for routine follow-up in one year for pelvic examination.  ____________________________________  Blair Promise, PhD, MD  This document serves as a record of services personally performed by Gery Pray, MD. It was created on his behalf by Wilburn Mylar, a trained medical scribe. The creation of this record is based on the scribe's personal observations and the provider's statements to them. This document has been checked and approved by the attending provider.

## 2018-12-17 NOTE — Patient Instructions (Signed)
Coronavirus (COVID-19) Are you at risk?  Are you at risk for the Coronavirus (COVID-19)?  To be considered HIGH RISK for Coronavirus (COVID-19), you have to meet the following criteria:  . Traveled to [place], [place], [place], [place] or [place]; or in the [place] to [place], [place], [place], or [place]; and have fever, cough, and shortness of breath within the last 2 weeks of travel OR . Been in close contact with a person diagnosed with COVID-19 within the last 2 weeks and have fever, cough, and shortness of breath . IF YOU DO NOT MEET THESE CRITERIA, YOU ARE CONSIDERED LOW RISK FOR COVID-19.  What to do if you are HIGH RISK for COVID-19?  . If you are having a medical emergency, call 911. . Seek medical care right away. Before you go to a doctor's office, urgent care or emergency department, call ahead and tell them about your recent travel, contact with someone diagnosed with COVID-19, and your symptoms. You should receive instructions from your physician's office regarding next steps of care.  . When you arrive at healthcare provider, tell the healthcare staff immediately you have returned from visiting [place], [place], [place], [place] or [place]; or traveled in the [place] to [place], [place], [place], or [place]; in the last two weeks or you have been in close contact with a person diagnosed with COVID-19 in the last 2 weeks.   . Tell the health care staff about your symptoms: fever, cough and shortness of breath. . After you have been seen by a medical provider, you will be either: o Tested for (COVID-19) and discharged home on quarantine except to seek medical care if symptoms worsen, and asked to  - Stay home and avoid contact with others until you get your results (4-5 days)  - Avoid travel on public transportation if possible (such as bus, train, or airplane) or o Sent to the Emergency Department by EMS for evaluation, COVID-19 testing, and possible  admission depending on your condition and test results.  What to do if you are LOW RISK for COVID-19?  Reduce your risk of any infection by using the same precautions used for avoiding the common cold or flu:  . Wash your hands often with soap and warm water for at least 20 seconds.  If soap and water are not readily available, use an alcohol-based hand sanitizer with at least 60% alcohol.  . If coughing or sneezing, cover your mouth and nose by coughing or sneezing into the elbow areas of your shirt or coat, into a tissue or into your sleeve (not your hands). . Avoid shaking hands with others and consider head nods or verbal greetings only. . Avoid touching your eyes, nose, or mouth with unwashed hands.  . Avoid close contact with people who are sick. . Avoid places or events with large numbers of people in one location, like concerts or sporting events. . Carefully consider travel plans you have or are making. . If you are planning any travel outside or inside the [place], visit the CDC's Travelers' Health webpage for the latest health notices. . If you have some symptoms but not all symptoms, continue to monitor at home and seek medical attention if your symptoms worsen. . If you are having a medical emergency, call 911.   ADDITIONAL HEALTHCARE OPTIONS FOR PATIENTS  [facility] / e-Visit: [email or web address]         [facility]: [phone]  [facility]: [phone]                   [facility]: [phone]   

## 2019-07-07 ENCOUNTER — Ambulatory Visit
Admission: EM | Admit: 2019-07-07 | Discharge: 2019-07-07 | Disposition: A | Discharge status: Home / Self Care | Payer: Medicare Other | Attending: Emergency Medicine | Admitting: Emergency Medicine

## 2019-07-07 ENCOUNTER — Other Ambulatory Visit: Payer: Self-pay

## 2019-07-07 ENCOUNTER — Encounter: Payer: Self-pay | Admitting: Emergency Medicine

## 2019-07-07 DIAGNOSIS — S46911A Strain of unspecified muscle, fascia and tendon at shoulder and upper arm level, right arm, initial encounter: Secondary | ICD-10-CM

## 2019-07-07 DIAGNOSIS — X500XXA Overexertion from strenuous movement or load, initial encounter: Secondary | ICD-10-CM

## 2019-07-07 MED ORDER — NAPROXEN 500 MG PO TABS: 500.0000 mg | ORAL_TABLET | Freq: Two times a day (BID) | ORAL | 0 refills | Status: DC

## 2019-07-07 NOTE — ED Triage Notes (Signed)
Right shoulder pain for 2 weeks.  Patient is right handed.  No known injury.  Currently, right shoulder is not hurting.  Patient feels pain as she lifts right arm toward shoulder level and increases when above shoulder level

## 2019-07-07 NOTE — ED Provider Notes (Signed)
[facility]    CSN: [id number] Arrival date & time: 07/07/19  1444      History   Chief Complaint Chief Complaint  Patient presents with  . Shoulder Pain    HPI Tracey Oneill is a 69 y.o. female presenting for right shoulder pain.  States this has been ongoing for the last 2 weeks.  Patient denies injury, fall, trauma to the area.  Patient works in [employer] and has to [employer].  Patient feels that she has "a pulled muscle or muscle strain ".  Patient feels pain over back of shoulder extending to trapezius.  Denies neck pain, headache, back pain, upper extremity numbness or weakness.  Has not tried anything for this as she does not like to take medications.   Past Medical History:  Diagnosis Date  . Cancer ([place])     There are no problems to display for this patient.   Past Surgical History:  Procedure Laterality Date  . ABDOMINAL HYSTERECTOMY      OB History   No obstetric history on file.      Home Medications    Prior to Admission medications   Medication Sig Start Date End Date Taking? Authorizing Provider  naproxen (NAPROSYN) 500 MG tablet Take 1 tablet (500 mg total) by mouth 2 (two) times daily. 07/07/19   Hall-Potvin, [place], PA-C    Family History History reviewed. No pertinent family history.  Social History Social History   Tobacco Use  . Smoking status: Never Smoker  Substance Use Topics  . Alcohol use: Never  . Drug use: Never     Allergies   Codeine, Garlic, and Neosporin [bacitracin-polymyxin b]   Review of Systems As per HPI   Physical Exam Triage Vital Signs ED Triage Vitals  Enc Vitals Group     BP      Pulse      Resp      Temp      Temp src      SpO2      Weight      Height      Head Circumference      Peak Flow      Pain Score      Pain Loc      Pain Edu?      Excl. in [place]?    No data found.  Updated Vital Signs BP 114/65 (BP Location: Left Arm)   Pulse 99   Temp 98.2 F  (36.8 C) (Oral)   Resp 18   SpO2 98%   Visual Acuity Right Eye Distance:   Left Eye Distance:   Bilateral Distance:    Right Eye Near:   Left Eye Near:    Bilateral Near:     Physical Exam Constitutional:      General: She is not in acute distress. HENT:     Head: Normocephalic and atraumatic.  Eyes:     General: No scleral icterus.    Pupils: Pupils are equal, round, and reactive to light.  Cardiovascular:     Rate and Rhythm: Normal rate.  Pulmonary:     Effort: Pulmonary effort is normal.  Musculoskeletal:     Right shoulder: Tenderness present. No swelling, deformity, effusion, bony tenderness or crepitus. Normal range of motion. Normal strength. Normal pulse.     Left shoulder: Normal.       Arms:  Skin:    Coloration: Skin is not jaundiced or pale.  Neurological:  Mental Status: She is alert and oriented to person, place, and time.      UC Treatments / Results  Labs (all labs ordered are listed, but only abnormal results are displayed) Labs Reviewed - No data to display  EKG   Radiology No results found.  Procedures Procedures (including critical care time)  Medications Ordered in UC Medications - No data to display  Initial Impression / Assessment and Plan / UC Course  I have reviewed the triage vital signs and the nursing notes.  Pertinent labs & imaging results that were available during my care of the patient were reviewed by me and considered in my medical decision making (see chart for details).     Patient appears well in office today exam reassuring.  No mechanism of injury: Radiography deferred.  Likely musculoskeletal strain second overuse given work.  Patient requesting work note for few days off to rest.  Agreeable to this, also advised patient to ice, try NSAID for additional pain relief.  Return precautions discussed, patient verbalized understanding and is agreeable to plan. Final Clinical Impressions(s) / UC Diagnoses   Final  diagnoses:  Strain of right shoulder, initial encounter     Discharge Instructions     Continue icing, take naproxen as directed. Return for worsening pain, numbness, swelling.    ED Prescriptions    Medication Sig Dispense Auth. Provider   naproxen (NAPROSYN) 500 MG tablet Take 1 tablet (500 mg total) by mouth 2 (two) times daily. 14 tablet Hall-Potvin, [place], PA-C     I have reviewed the PDMP during this encounter.   Hall-Potvin, [place], [place] 07/07/19 1638

## 2019-07-07 NOTE — Discharge Instructions (Signed)
Continue icing, take naproxen as directed. Return for worsening pain, numbness, swelling.

## 2019-07-08 ENCOUNTER — Encounter: Payer: Self-pay | Admitting: Radiation Oncology

## 2019-07-26 ENCOUNTER — Other Ambulatory Visit: Payer: Self-pay

## 2019-07-26 ENCOUNTER — Encounter (HOSPITAL_COMMUNITY): Payer: Self-pay | Admitting: Emergency Medicine

## 2019-07-26 ENCOUNTER — Emergency Department (HOSPITAL_COMMUNITY)
Admission: EM | Admit: 2019-07-26 | Discharge: 2019-07-27 | Disposition: A | Discharge status: Home / Self Care | Payer: No Typology Code available for payment source | Attending: Emergency Medicine | Admitting: Emergency Medicine

## 2019-07-26 DIAGNOSIS — Y990 Civilian activity done for income or pay: Secondary | ICD-10-CM | POA: Diagnosis not present

## 2019-07-26 DIAGNOSIS — Z79899 Other long term (current) drug therapy: Secondary | ICD-10-CM | POA: Diagnosis not present

## 2019-07-26 DIAGNOSIS — Y92239 Unspecified place in hospital as the place of occurrence of the external cause: Secondary | ICD-10-CM | POA: Diagnosis not present

## 2019-07-26 DIAGNOSIS — S0003XA Contusion of scalp, initial encounter: Secondary | ICD-10-CM | POA: Insufficient documentation

## 2019-07-26 DIAGNOSIS — W228XXA Striking against or struck by other objects, initial encounter: Secondary | ICD-10-CM | POA: Insufficient documentation

## 2019-07-26 DIAGNOSIS — Z8542 Personal history of malignant neoplasm of other parts of uterus: Secondary | ICD-10-CM | POA: Insufficient documentation

## 2019-07-26 DIAGNOSIS — Y939 Activity, unspecified: Secondary | ICD-10-CM | POA: Diagnosis not present

## 2019-07-26 DIAGNOSIS — S0990XA Unspecified injury of head, initial encounter: Secondary | ICD-10-CM

## 2019-07-26 LAB — CBC WITH DIFFERENTIAL/PLATELET
Abs Immature Granulocytes: 0.03 10*3/uL (ref 0.00–0.07)
Basophils Absolute: 0 10*3/uL (ref 0.0–0.1)
Basophils Relative: 1 %
Eosinophils Absolute: 0.1 10*3/uL (ref 0.0–0.5)
Eosinophils Relative: 2 %
HCT: 38.8 % (ref 36.0–46.0)
Hemoglobin: 12.7 g/dL (ref 12.0–15.0)
Immature Granulocytes: 0 %
Lymphocytes Relative: 30 %
Lymphs Abs: 2.1 10*3/uL (ref 0.7–4.0)
MCH: 29.5 pg (ref 26.0–34.0)
MCHC: 32.7 g/dL (ref 30.0–36.0)
MCV: 90 fL (ref 80.0–100.0)
Monocytes Absolute: 0.7 10*3/uL (ref 0.1–1.0)
Monocytes Relative: 10 %
Neutro Abs: 4 10*3/uL (ref 1.7–7.7)
Neutrophils Relative %: 57 %
Platelets: 336 10*3/uL (ref 150–400)
RBC: 4.31 MIL/uL (ref 3.87–5.11)
RDW: 12.9 % (ref 11.5–15.5)
WBC: 6.9 10*3/uL (ref 4.0–10.5)
nRBC: 0 % (ref 0.0–0.2)

## 2019-07-26 LAB — BASIC METABOLIC PANEL
Anion gap: 7 (ref 5–15)
BUN: 19 mg/dL (ref 8–23)
CO2: 25 mmol/L (ref 22–32)
Calcium: 9.2 mg/dL (ref 8.9–10.3)
Chloride: 107 mmol/L (ref 98–111)
Creatinine, Ser: 0.92 mg/dL (ref 0.44–1.00)
GFR calc Af Amer: >60 mL/min (ref >60)
GFR calc non Af Amer: >60 mL/min (ref >60)
Glucose, Bld: 97 mg/dL (ref 70–99)
Potassium: 3.8 mmol/L (ref 3.5–5.1)
Sodium: 139 mmol/L (ref 135–145)

## 2019-07-26 NOTE — ED Provider Notes (Signed)
[facility] EMERGENCY DEPARTMENT Provider Note   CSN: [id number] Arrival date & time: 07/26/19  2029     History Chief Complaint  Patient presents with  . Head Injury    Tracey Oneill is a 69 y.o. female.  Patient with remote history of endometrial carcinoma presenting with head injury.  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  She was struck on the right side of the head with a metal door of a food cart about 11:30 AM.  She developed a headache with nausea and dizziness and lightheadedness since.  This is progressively worsening.  She comes in because she feels dizzy and lightheaded and having nausea.  No vomiting.  Did not take anything for pain.  No loss of consciousness.  No vomiting.  No focal weakness, numbness or tingling.  No chest pain or shortness of breath.  No neck or back pain.  Denies any blood thinner use. No fevers, chills, nausea or vomiting.  She felt well prior to this incident. She denies any room spinning dizziness.  She has some nausea and lightheadedness. No visual changes. No spots in vision. No blurry or double vision.  The history is provided by the patient.  Head Injury Associated symptoms: headache and nausea   Associated symptoms: no vomiting        Past Medical History:  Diagnosis Date  . Cancer [facility])    malignant neoplasm of corpus uteri except  isthmus  . Cancer ([place])   . History of brachytherapy 10/3,7,12/2011   vaginal intracavitary 4500 cGy 25 fx  . History of radiation therapy 11/17/11-12/22/11   endometrial ca   . Postmenopausal     Patient Active Problem List   Diagnosis Date Noted  . Endometrial carcinoma ([place]) 08/26/2011  . Pre-operative cardiovascular examination 07/19/2011  . Post-menopausal bleeding 06/01/2011    Past Surgical History:  Procedure Laterality Date  . ABDOMINAL HYSTERECTOMY    . BUNIONECTOMY  1991   LEFT  . TUBAL LIGATION       OB History    Gravida  2   Para    2   Term  2   Preterm  0   AB  0   Living  2     SAB  0   TAB  0   Ectopic  0   Multiple      Live Births  2           Family History  Problem Relation Age of Onset  . Cancer Sister        COLON  . Diabetes Sister        younger  . Diabetes Sister        older  . Stroke Father   . Heart attack Sister 53    Social History   Tobacco Use  . Smoking status: Never Smoker  . Smokeless tobacco: Never Used  Substance Use Topics  . Alcohol use: Never  . Drug use: Never    Home Medications Prior to Admission medications   Medication Sig Start Date End Date Taking? Authorizing Provider  naproxen (NAPROSYN) 500 MG tablet Take 1 tablet (500 mg total) by mouth 2 (two) times daily. 07/07/19   Hall-Potvin, [place], PA-C    Allergies    Garlic, Neosporin [neomycin-bacitracin zn-polymyx], Codeine, Codeine, Garlic, and Neosporin [bacitracin-polymyxin b]  Review of Systems   Review of Systems  Constitutional: Negative for activity change, appetite change and fever.  HENT: Negative for  congestion and rhinorrhea.   Eyes: Negative for visual disturbance.  Respiratory: Negative for cough, chest tightness and shortness of breath.   Gastrointestinal: Positive for nausea. Negative for vomiting.  Genitourinary: Negative for dysuria.  Musculoskeletal: Negative for arthralgias, back pain and myalgias.  Skin: Negative for rash.  Neurological: Positive for dizziness, light-headedness and headaches. Negative for weakness.   all other systems are negative except as noted in the HPI and PMH.    Physical Exam Updated Vital Signs BP (!) 161/74 (BP Location: Right Arm)   Pulse 87   Temp 98 F (36.7 C) (Oral)   Resp 18   Ht 5\' 4"  (1.626 m)   Wt 65 kg   SpO2 98%   BMI 24.60 kg/m   Physical Exam Vitals and nursing note reviewed.  Constitutional:      General: She is not in acute distress.    Appearance: She is well-developed.  HENT:     Head: Normocephalic and  atraumatic.      Comments: No septal hematoma or hemotympanum  Tenderness with small hematoma.    Mouth/Throat:     Pharynx: No oropharyngeal exudate.  Eyes:     Conjunctiva/sclera: Conjunctivae normal.     Pupils: Pupils are equal, round, and reactive to light.  Neck:     Comments: No meningismus. No C spine tenderness. Cardiovascular:     Rate and Rhythm: Normal rate and regular rhythm.     Heart sounds: Normal heart sounds. No murmur.  Pulmonary:     Effort: Pulmonary effort is normal. No respiratory distress.     Breath sounds: Normal breath sounds.  Abdominal:     Palpations: Abdomen is soft.     Tenderness: There is no abdominal tenderness. There is no guarding or rebound.  Musculoskeletal:        General: No tenderness. Normal range of motion.     Cervical back: Normal range of motion and neck supple.     Comments: No T or L spine tenderness  Skin:    General: Skin is warm.  Neurological:     Mental Status: She is alert and oriented to person, place, and time.     Cranial Nerves: No cranial nerve deficit.     Motor: No abnormal muscle tone.     Coordination: Coordination normal.     Comments: CN 2-12 intact, no ataxia on finger to nose, no nystagmus, 5/5 strength throughout, no pronator drift, Romberg negative, normal gait.   Psychiatric:        Behavior: Behavior normal.     ED Results / Procedures / Treatments   Labs (all labs ordered are listed, but only abnormal results are displayed) Labs Reviewed  CBC WITH DIFFERENTIAL/PLATELET  BASIC METABOLIC PANEL    EKG None  Radiology CT Head Wo Contrast  Result Date: 07/27/2019 CLINICAL DATA:  Status post trauma. EXAM: CT HEAD WITHOUT CONTRAST TECHNIQUE: Contiguous axial images were obtained from the base of the skull through the vertex without intravenous contrast. COMPARISON:  None. FINDINGS: Brain: No evidence of acute infarction, hemorrhage, hydrocephalus, extra-axial collection or mass lesion/mass effect.  Vascular: No hyperdense vessel or unexpected calcification. Skull: Normal. Negative for fracture or focal lesion. Sinuses/Orbits: No acute finding. Other: None. IMPRESSION: No acute intracranial pathology. Electronically Signed   By: Virgina Norfolk M.D.   On: 07/27/2019 00:33    Procedures Procedures (including critical care time)  Medications Ordered in ED Medications - No data to display  ED Course  I have reviewed  the triage vital signs and the nursing notes.  Pertinent labs & imaging results that were available during my care of the patient were reviewed by me and considered in my medical decision making (see chart for details).    MDM Rules/Calculators/A&P                      Blunt head trauma with nausea and dizziness.  No loss of consciousness.  No Vomiting.  Nonfocal neurological exam. No visual changes.   Given patient's age, CT imaging is obtained.  This is negative for skull fracture or intracranial injury.  Suspect closed head injury with concussion.  Discussed with patient that dizziness, lightheadedness and nausea may persist for several weeks.  Advised hydration at home, over-the-counter analgesics, rest, PCP follow-up. Return precautions discussed.  Final Clinical Impression(s) / ED Diagnoses Final diagnoses:  Closed head injury, initial encounter    Rx / DC Orders ED Discharge Orders    None       Dimarco Minkin, Annie Main, MD 07/27/19 [phone]

## 2019-07-26 NOTE — ED Triage Notes (Signed)
Patient accidentally hit by a food cart door while at work this evening , denies LOC / skin intact , alert and oriented , reports mild right temporal headache .

## 2019-07-27 ENCOUNTER — Emergency Department (HOSPITAL_COMMUNITY): Payer: No Typology Code available for payment source

## 2019-07-27 NOTE — Discharge Instructions (Signed)
Your CT is negative.  As we discussed, headaches, dizziness, nausea and lightheadedness may persist for several weeks.  You should rest your brain as we discussed.  Follow-up with your doctor.  Return to the ED with new or worsening symptoms.

## 2019-07-27 NOTE — ED Notes (Signed)
Pt transported to CT via stretcher. NAD noted

## 2019-07-27 NOTE — ED Notes (Signed)
Patient verbalizes understanding of discharge instructions. Opportunity for questioning and answers were provided. Armband removed by staff, pt discharged from ED stable & ambulatory  

## 2019-09-09 ENCOUNTER — Ambulatory Visit
Admission: EM | Admit: 2019-09-09 | Discharge: 2019-09-09 | Disposition: A | Discharge status: Home / Self Care | Payer: Medicare Other | Attending: Emergency Medicine | Admitting: Emergency Medicine

## 2019-09-09 ENCOUNTER — Other Ambulatory Visit: Payer: Self-pay

## 2019-09-09 DIAGNOSIS — J069 Acute upper respiratory infection, unspecified: Secondary | ICD-10-CM | POA: Diagnosis not present

## 2019-09-09 DIAGNOSIS — R430 Anosmia: Secondary | ICD-10-CM | POA: Diagnosis not present

## 2019-09-09 MED ORDER — CETIRIZINE HCL 10 MG PO TABS: 10.0000 mg | ORAL_TABLET | Freq: Every day | ORAL | 0 refills | Status: DC

## 2019-09-09 MED ORDER — FLUTICASONE PROPIONATE 50 MCG/ACT NA SUSP: 1.0000 | Freq: Every day | NASAL | 0 refills | Status: DC

## 2019-09-09 MED ORDER — BENZONATATE 100 MG PO CAPS: 100.0000 mg | ORAL_CAPSULE | Freq: Three times a day (TID) | ORAL | 0 refills | Status: DC

## 2019-09-09 NOTE — Discharge Instructions (Signed)

## 2019-09-09 NOTE — ED Provider Notes (Signed)
[facility]    CSN: [id number] Arrival date & time: 09/09/19  1308      History   Chief Complaint Chief Complaint  Patient presents with  . Cough    HPI Tracey Oneill is a 69 y.o. female with history of endometrial cancer presenting for 2-day course of dry cough, runny nose, loss of taste and smell.  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  Denies fever, shortness of breath, chest pain or palpitations, lower extremity edema.  Patient not currently under treatment for endometrial carcinoma.  Not take anything for symptoms.  Denies known sick contacts.  Past Medical History:  Diagnosis Date  . Cancer [facility])    malignant neoplasm of corpus uteri except  isthmus  . Cancer ([place])   . History of brachytherapy 10/3,7,12/2011   vaginal intracavitary 4500 cGy 25 fx  . History of radiation therapy 11/17/11-12/22/11   endometrial ca   . Postmenopausal     Patient Active Problem List   Diagnosis Date Noted  . Endometrial carcinoma ([place]) 08/26/2011  . Pre-operative cardiovascular examination 07/19/2011  . Post-menopausal bleeding 06/01/2011    Past Surgical History:  Procedure Laterality Date  . ABDOMINAL HYSTERECTOMY    . BUNIONECTOMY  1991   LEFT  . TUBAL LIGATION      OB History    Gravida  2   Para  2   Term  2   Preterm  0   AB  0   Living  2     SAB  0   TAB  0   Ectopic  0   Multiple      Live Births  2            Home Medications    Prior to Admission medications   Medication Sig Start Date End Date Taking? Authorizing Provider  benzonatate (TESSALON) 100 MG capsule Take 1 capsule (100 mg total) by mouth every 8 (eight) hours. 09/09/19   Hall-Potvin, [place], PA-C  cetirizine (ZYRTEC ALLERGY) 10 MG tablet Take 1 tablet (10 mg total) by mouth daily. 09/09/19   Hall-Potvin, [place], PA-C  fluticasone (FLONASE) 50 MCG/ACT nasal spray Place 1 spray into both nostrils daily. 09/09/19   Hall-Potvin,  [place], PA-C    Family History Family History  Problem Relation Age of Onset  . Cancer Sister        COLON  . Diabetes Sister        younger  . Diabetes Sister        older  . Stroke Father   . Heart attack Sister 29    Social History Social History   Tobacco Use  . Smoking status: Never Smoker  . Smokeless tobacco: Never Used  Substance Use Topics  . Alcohol use: Never  . Drug use: Never     Allergies   Garlic, Garlic, Neosporin [neomycin-bacitracin zn-polymyx], Codeine, Codeine, and Neosporin [bacitracin-polymyxin b]   Review of Systems As per HPI   Physical Exam Triage Vital Signs ED Triage Vitals  Enc Vitals Group     BP      Pulse      Resp      Temp      Temp src      SpO2      Weight      Height      Head Circumference      Peak Flow      Pain Score  Pain Loc      Pain Edu?      Excl. in [place]?    No data found.  Updated Vital Signs BP 118/77 (BP Location: Left Arm)   Pulse (!) 110   Temp 99.1 F (37.3 C) (Oral)   Resp 18   SpO2 96%   Visual Acuity Right Eye Distance:   Left Eye Distance:   Bilateral Distance:    Right Eye Near:   Left Eye Near:    Bilateral Near:     Physical Exam Constitutional:      General: She is not in acute distress.    Appearance: She is obese. She is not ill-appearing or diaphoretic.  HENT:     Head: Normocephalic and atraumatic.     Right Ear: Tympanic membrane, ear canal and external ear normal.     Left Ear: Tympanic membrane, ear canal and external ear normal.     Mouth/Throat:     Mouth: Mucous membranes are moist.     Pharynx: Oropharynx is clear. No oropharyngeal exudate or posterior oropharyngeal erythema.  Eyes:     General: No scleral icterus.    Conjunctiva/sclera: Conjunctivae normal.     Pupils: Pupils are equal, round, and reactive to light.  Neck:     Comments: Trachea midline, negative JVD Cardiovascular:     Rate and Rhythm: Regular rhythm. Tachycardia present.     Heart  sounds: No murmur heard.  No gallop.   Pulmonary:     Effort: Pulmonary effort is normal. No respiratory distress.     Breath sounds: No wheezing, rhonchi or rales.  Musculoskeletal:     Cervical back: Neck supple. No tenderness.  Lymphadenopathy:     Cervical: No cervical adenopathy.  Skin:    Capillary Refill: Capillary refill takes less than 2 seconds.     Coloration: Skin is not jaundiced or pale.     Findings: No rash.  Neurological:     General: No focal deficit present.     Mental Status: She is alert and oriented to person, place, and time.      UC Treatments / Results  Labs (all labs ordered are listed, but only abnormal results are displayed) Labs Reviewed  NOVEL CORONAVIRUS, NAA    EKG   Radiology No results found.  Procedures Procedures (including critical care time)  Medications Ordered in UC Medications - No data to display  Initial Impression / Assessment and Plan / UC Course  I have reviewed the triage vital signs and the nursing notes.  Pertinent labs & imaging results that were available during my care of the patient were reviewed by me and considered in my medical decision making (see chart for details).     Patient afebrile, nontoxic, with SpO2 96%.  EKG done in office, reviewed by me and compared to previous from 07/19/2011: Sinus tachycardia with ventricular rate of 106 bpm.  Patient does have QTC prolongation, though decreased from previous.  No ST elevation or depression.  Patient does have possible LAE with LBBB which is stable.  Waveforms unchanged in all leads: Nonacute EKG.  Reviewed findings with patient verbalized understanding.  Covid PCR pending.  Patient to quarantine until results are back.  We will treat supportively as outlined below.  Return precautions discussed, patient verbalized understanding and is agreeable to plan. Final Clinical Impressions(s) / UC Diagnoses   Final diagnoses:  Loss of smell  URI with cough and congestion       Discharge Instructions  Tessalon for cough. Start flonase, atrovent nasal spray for nasal congestion/drainage. You can use over the counter nasal saline rinse such as neti pot for nasal congestion. Keep hydrated, your urine should be clear to pale yellow in color. Tylenol/motrin for fever and pain. Monitor for any worsening of symptoms, chest pain, shortness of breath, wheezing, swelling of the throat, go to the emergency department for further evaluation needed.     ED Prescriptions    Medication Sig Dispense Auth. Provider   cetirizine (ZYRTEC ALLERGY) 10 MG tablet Take 1 tablet (10 mg total) by mouth daily. 30 tablet Hall-Potvin, [place], PA-C   fluticasone (FLONASE) 50 MCG/ACT nasal spray Place 1 spray into both nostrils daily. 16 g Hall-Potvin, [place], PA-C   benzonatate (TESSALON) 100 MG capsule Take 1 capsule (100 mg total) by mouth every 8 (eight) hours. 21 capsule Hall-Potvin, [place], PA-C     PDMP not reviewed this encounter.   Hall-Potvin, [place], [place] 09/09/19 1532

## 2019-09-09 NOTE — ED Triage Notes (Addendum)
Nonproductive cough x 2 days. "I have allergies." Pt not taking anything OTC for symptoms. Pt states she cannot taste or smell anything x 3 days.  Pt also c/o head "cloudiness" after head injury on April 30.

## 2019-09-10 LAB — SARS-COV-2, NAA 2 DAY TAT

## 2019-09-10 LAB — NOVEL CORONAVIRUS, NAA: SARS-CoV-2, NAA: DETECTED — AB

## 2019-09-11 ENCOUNTER — Telehealth: Payer: Self-pay | Admitting: Unknown Physician Specialty

## 2019-09-11 NOTE — Telephone Encounter (Signed)
Called to discuss with patient about Covid symptoms and the use of bamlanivimab, a monoclonal antibody infusion for those with mild to moderate Covid symptoms and at a high risk of hospitalization.  Pt is qualified for this infusion at the [facility] due to Age > 65   Message left to call back  

## 2019-12-20 ENCOUNTER — Telehealth: Payer: Self-pay | Admitting: *Deleted

## 2019-12-20 NOTE — Telephone Encounter (Signed)
CALLED PATIENT TO ALTER FU APPT. ON 12-23-19 DUE TO DR. KINARD BEING IN THE OR, RESCHEDULED FOR 01-13-20 @ 11 AM, PATIENT AGREED TO NEW DATE AND TIME

## 2019-12-23 ENCOUNTER — Ambulatory Visit: Payer: Medicare Other | Admitting: Radiation Oncology

## 2020-01-12 NOTE — Progress Notes (Signed)
Radiation Oncology         [phone] ________________________________  Name: Tracey Oneill MRN: [id number]  Date: 01/13/2020  DOB: [birth year]  Follow-Up Visit Note  CC: Patient, No Pcp Per  [name], MD    ICD-10-CM   1. Endometrial carcinoma ([facility])  C54.1     Diagnosis:   69 y.o. woman with Stage III-C endometrial adenocarcinoma.  Interval Since Last Radiation: Eight years, one week, and one day  External beam: August 22 through December 22, 2011 Intracavitary brachytherapy treatments: October 3 of October 7 of January 05, 2012  Site/dose: Whole pelvis 4500 cGy in 25 fractions. The vaginal cuff area received a cumulative brachytherapy dose of 1800 cGy  Narrative: The patient returns for annual follow-up. Since her last visit, she was seen in the Urgent Care/ED on three separate occasions. Visits are as follows: 1. 07/07/2019 for right shoulder strain. 2. 07/26/2019 for head injury with associated headache, nausea, dizziness, and lightheadedness. CT scan of head at that time did not show any acute intracranial pathology. 3. 09/09/2019 for dry cough, rhinorrhea, and loss of taste and smell. COVID-19 test came back positive.  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 She denies vaginal bleeding hematuria or rectal bleeding.  She denies any problems with abdominal bloating or pelvic pain.  ALLERGIES:  is allergic to garlic, garlic, neosporin [neomycin-bacitracin zn-polymyx], codeine, codeine, and neosporin [bacitracin-polymyxin b].  Meds: No current outpatient medications on file.   No current facility-administered medications for this encounter.   Physical Findings: The patient is in no acute distress. Patient is alert and oriented.  height is 5\' 4"  (1.626 m) and weight is 108 lb 6.4 oz (49.2 kg). Her temperature is 97.4 F (36.3 C) (abnormal). Her blood pressure  is 115/68 and her pulse is 79. Her respiration is 18 and oxygen saturation is 100%. . Lungs are clear to auscultation bilaterally. Heart has regular rate and rhythm. No palpable cervical, supraclavicular, or axillary adenopathy. Abdomen soft, non-tender, normal bowel sounds.   Pelvic exam: On pelvic examination the external genitalia were unremarkable. A speculum exam was performed. There are no mucosal lesions noted in the vaginal vault.  Mild radiation changes at the vaginal cuff. on bimanual and rectovaginal examination there were no pelvic masses appreciated.     Lab Findings: Lab Results  Component Value Date   WBC 6.9 07/26/2019   HGB 12.7 07/26/2019   HCT 38.8 07/26/2019   MCV 90.0 07/26/2019   PLT 336 07/26/2019    Radiographic Findings: No results found.  Impression: Stage III-C endometrial adenocarcinoma  No evidence of recurrence on clinical exam today. She does not wish to pursue getting a PCP or GYN physician for follow-up. I again encouraged her to obtain a primary care physician to obtain routine blood work and screening procedures.   Plan: The patient will return in one year for a pelvic exam.  Total time spent in this encounter was 21 minutes which included reviewing the patient's most recent ED visits, physical examination, and documentation. ____________________________________  Blair Promise, PhD, MD  This document serves as a record of services personally performed by Gery Pray, MD. It was created on his behalf by Clerance Lav, a trained medical scribe. The creation of this record is based on the scribe's personal observations and the provider's statements to them. This document has been checked and approved by the  attending provider.

## 2020-01-13 ENCOUNTER — Other Ambulatory Visit: Payer: Self-pay

## 2020-01-13 ENCOUNTER — Encounter: Payer: Self-pay | Admitting: Radiation Oncology

## 2020-01-13 ENCOUNTER — Ambulatory Visit
Admission: RE | Admit: 2020-01-13 | Discharge: 2020-01-13 | Disposition: A | Discharge status: Home / Self Care | Payer: Medicare Other | Source: Ambulatory Visit | Attending: Radiation Oncology | Admitting: Radiation Oncology

## 2020-01-13 DIAGNOSIS — Z8542 Personal history of malignant neoplasm of other parts of uterus: Secondary | ICD-10-CM | POA: Diagnosis not present

## 2020-01-13 DIAGNOSIS — Z923 Personal history of irradiation: Secondary | ICD-10-CM | POA: Diagnosis not present

## 2020-01-13 DIAGNOSIS — C541 Malignant neoplasm of endometrium: Secondary | ICD-10-CM

## 2020-01-13 DIAGNOSIS — Z8616 Personal history of COVID-19: Secondary | ICD-10-CM | POA: Insufficient documentation

## 2020-01-13 NOTE — Progress Notes (Signed)
Patient here for a yearly f/u visit with Dr. Sondra Come. Patient denies bleeding, pain or bladder/bowel problems.  BP 115/68 (BP Location: Right Arm, Patient Position: Sitting, Cuff Size: Normal)   Pulse 79   Temp (!) 97.4 F (36.3 C)   Resp 18   Ht 5\' 4"  (1.626 m)   Wt 108 lb 6.4 oz (49.2 kg)   SpO2 100%   BMI 18.61 kg/m   Wt Readings from Last 3 Encounters:  01/13/20 108 lb 6.4 oz (49.2 kg)  07/26/19 143 lb 4.8 oz (65 kg)  12/17/18 118 lb 6 oz (53.7 kg)

## 2021-01-08 ENCOUNTER — Encounter: Payer: Self-pay | Admitting: Radiology

## 2021-01-13 NOTE — Progress Notes (Signed)
  Radiation Oncology         [phone] ________________________________  Name: Tracey Oneill MRN: [id number]  Date: 01/14/2021  DOB: 12/25/1950  Follow-Up Visit Note  CC: Patient, No Pcp Per (Inactive)  Livesay, Tamala Julian, MD    ICD-10-CM   1. Endometrial carcinoma ([place])  C54.1       Diagnosis: Stage III-C endometrial adenocarcinoma  Interval Since Last Radiation: 9 years and 10 days   Radiation Treatment Dates:  --External beam: August 22 through December 22, 2011 --Intracavitary brachytherapy treatments: October 3 of October 7 of January 05, 2012  Site/dose: Whole pelvis 4500 cGy in 25 fractions. The vaginal cuff area received a cumulative brachytherapy dose of 1800 cGy  Narrative:  The patient returns today for routine annual follow-up, she was last seen her for follow-up on 01/13/20.   No significant interval history since the patient was last seen.  She denies any new medical issues.  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  She denies any hematuria or rectal bleeding.  The patient missed her scheduled screening mammogram but plans on rescheduling this in the near future.  Denies any pain within either breast nipple discharge or bleeding.  She does do self examinations.                     Allergies:  is allergic to garlic, garlic, neosporin [neomycin-bacitracin zn-polymyx], codeine, codeine, and neosporin [bacitracin-polymyxin b].  Meds: No current outpatient medications on file.   No current facility-administered medications for this encounter.    Physical Findings: The patient is in no acute distress. Patient is alert and oriented.  weight is 103 lb 6.4 oz (46.9 kg). Her temperature is 98 F (36.7 C). Her blood pressure is 128/57 (abnormal) and her pulse is 97. Her respiration is 18 and oxygen saturation is 99%. .  No significant changes.  Lungs are clear to auscultation bilaterally. Heart has regular rate and rhythm. No palpable cervical, supraclavicular, or axillary adenopathy. Abdomen soft, non-tender, normal bowel sounds.  Pelvic examination the external genitalia unremarkable.  A speculum exam was performed.  There are no mucosal lesions noted in the vaginal vault.  On bimanual and rectovaginal examination there are no pelvic masses appreciated.  Rectal sphincter tone normal.  Vaginal cuff intact.   Lab Findings: Lab Results  Component Value Date   WBC 6.9 07/26/2019   HGB 12.7 07/26/2019   HCT 38.8 07/26/2019   MCV 90.0 07/26/2019   PLT 336 07/26/2019    Radiographic Findings: No results found.  Impression:  Stage III-C endometrial adenocarcinoma  No evidence of recurrence on clinical exam today.  Again discussed with patient the importance of having a primary care physician and a gynecologist.  Plan: Routine follow-up in 1 year unless she is seen by a gynecologist.   21 minutes of total time was spent for this patient encounter, including preparation, face-to-face counseling with the patient and coordination of care, physical exam, and documentation of the encounter. ____________________________________  Blair Promise, PhD, MD   This document serves as a record of services personally performed by Gery Pray, MD. It was created on his behalf by Roney Mans, a trained medical scribe. The creation of this record is based on the scribe's personal observations and the provider's statements to them. This document has been checked and approved by the attending provider.

## 2021-01-14 ENCOUNTER — Other Ambulatory Visit: Payer: Self-pay

## 2021-01-14 ENCOUNTER — Ambulatory Visit
Admission: RE | Admit: 2021-01-14 | Discharge: 2021-01-14 | Disposition: A | Discharge status: Home / Self Care | Payer: Medicare Other | Source: Ambulatory Visit | Attending: Radiation Oncology | Admitting: Radiation Oncology

## 2021-01-14 ENCOUNTER — Encounter: Payer: Self-pay | Admitting: Radiation Oncology

## 2021-01-14 DIAGNOSIS — C541 Malignant neoplasm of endometrium: Secondary | ICD-10-CM | POA: Insufficient documentation

## 2021-01-14 DIAGNOSIS — Z923 Personal history of irradiation: Secondary | ICD-10-CM | POA: Insufficient documentation

## 2021-01-14 NOTE — Progress Notes (Signed)
Susann Lawhorne is here today for follow up post radiation to the pelvic.  They completed their radiation on: 01/05/12  Does the patient complain of any of the following:  Pain:Patient denies pain Abdominal bloating: no Diarrhea/Constipation: no Nausea/Vomiting: no Vaginal Discharge: no Blood in Urine or Stool: no Urinary Issues (dysuria/incomplete emptying/ incontinence/ increased frequency/urgency): no Does patient report using vaginal dilator 2-3 times a week and/or sexually active 2-3 weeks: no Post radiation skin changes: no  Additional comments if applicable:   Vitals:   01/14/21 1053  BP: (!) 128/57  Pulse: 97  Resp: 18  Temp: 98 F (36.7 C)  SpO2: 99%  Weight: 103 lb 6.4 oz (46.9 kg)

## 2022-01-12 NOTE — Progress Notes (Signed)
  Radiation Oncology         (336) [phone] ________________________________  Name: Tracey Oneill MRN: [id number]  Date: 01/13/2022  DOB: [birth year]  Follow-Up Visit Note  CC: Patient, No Pcp Per  [name], MD  No diagnosis found.  Diagnosis: Stage III-C endometrial adenocarcinoma  Interval Since Last Radiation: 10 years and 9 days   Radiation Treatment Dates:  --External beam: August 22 through December 22, 2011 --Intracavitary brachytherapy treatments: October 3 of October 7 of January 05, 2012   Site/dose: Whole pelvis 4500 cGy in 25 fractions. The vaginal cuff area received a cumulative brachytherapy dose of 1800 cGy  Narrative:  The patient returns today for routine annual follow-up, she was last seen here for follow-up on 01/14/21. No significant interval history since the patient was last seen for follow-up.  ***                             Allergies:  is allergic to garlic, garlic, neosporin [neomycin-bacitracin zn-polymyx], codeine, codeine, and neosporin [bacitracin-polymyxin b].  Meds: No current outpatient medications on file.   No current facility-administered medications for this encounter.    Physical Findings: The patient is in no acute distress. Patient is alert and oriented.  vitals were not taken for this visit. .  No significant changes. Lungs are clear to auscultation bilaterally. Heart has regular rate and rhythm. No palpable cervical, supraclavicular, or axillary adenopathy. Abdomen soft, non-tender, normal bowel sounds.  On pelvic examination the external genitalia were unremarkable. A speculum exam was performed. There are no mucosal lesions noted in the vaginal vault. A Pap smear was obtained of the proximal vagina. On bimanual and rectovaginal examination there were no pelvic masses appreciated. ***    Lab Findings: Lab Results  Component Value Date   WBC 6.9 07/26/2019   HGB 12.7 07/26/2019   HCT 38.8 07/26/2019   MCV 90.0  07/26/2019   PLT 336 07/26/2019    Radiographic Findings: No results found.  Impression:  Stage III-C endometrial adenocarcinoma  The patient is recovering from the effects of radiation.  ***  Plan:  ***   *** minutes of total time was spent for this patient encounter, including preparation, face-to-face counseling with the patient and coordination of care, physical exam, and documentation of the encounter. ____________________________________  Blair Promise, PhD, MD  This document serves as a record of services personally performed by Gery Pray, MD. It was created on his behalf by Roney Mans, a trained medical scribe. The creation of this record is based on the scribe's personal observations and the provider's statements to them. This document has been checked and approved by the attending provider.

## 2022-01-13 ENCOUNTER — Ambulatory Visit
Admission: RE | Admit: 2022-01-13 | Discharge: 2022-01-13 | Disposition: A | Discharge status: Home / Self Care | Payer: Medicare Other | Source: Ambulatory Visit | Attending: Radiation Oncology | Admitting: Radiation Oncology

## 2022-01-13 VITALS — BP 112/62 | HR 85 | Temp 97.8°F | Resp 20 | Ht 64.0 in | Wt 101.0 lb

## 2022-01-13 DIAGNOSIS — Z923 Personal history of irradiation: Secondary | ICD-10-CM | POA: Diagnosis not present

## 2022-01-13 DIAGNOSIS — Z8542 Personal history of malignant neoplasm of other parts of uterus: Secondary | ICD-10-CM | POA: Insufficient documentation

## 2022-01-13 DIAGNOSIS — C541 Malignant neoplasm of endometrium: Secondary | ICD-10-CM

## 2022-01-13 NOTE — Progress Notes (Signed)
Tracey Oneill is here today for follow up post radiation to the pelvic.  They completed their radiation on: October 2013  Does the patient complain of any of the following:  Pain:no Abdominal bloating: no Diarrhea/Constipation: no Nausea/Vomiting: no Vaginal Discharge: no Blood in Urine or Stool: no Urinary Issues (dysuria/incomplete emptying/ incontinence/ increased frequency/urgency): no Does patient report using vaginal dilator 2-3 times a week and/or sexually active 2-3 weeks: no Post radiation skin changes: no   Additional comments if applicable: None noted.  BP 112/62 (BP Location: Left Arm, Patient Position: Sitting, Cuff Size: Normal)   Pulse 85   Temp 97.8 F (36.6 C)   Resp 20   Ht '5\' 4"'$  (1.626 m)   Wt 101 lb (45.8 kg)   SpO2 100%   BMI 17.34 kg/m

## 2023-01-18 NOTE — Progress Notes (Signed)
Radiation Oncology         (336) [phone] ________________________________  Name: Tracey Oneill MRN: [id number]  Date: 01/19/2023  DOB: 06-18-1950  Follow-Up Visit Note  CC: Patient, No Pcp Per  Reece Packer, MD  No diagnosis found.  Diagnosis: Stage III-C endometrial adenocarcinoma   Interval Since Last Radiation: 11 years and 28 days   Radiation Treatment Dates:  --External beam: August 22 through December 22, 2011 --Intracavitary brachytherapy treatments: October 3 of October 7 of January 05, 2012   Site/dose: Whole pelvis 4500 cGy in 25 fractions. The vaginal cuff area received a cumulative brachytherapy dose of 1800 cGy   Narrative:  The patient returns today for routine annual follow-up. She was last seen here for follow-up on 01/13/22.   No significant interval history since she was last seen for follow-up.   ***                               Allergies:  is allergic to garlic, garlic, neosporin [neomycin-bacitracin zn-polymyx], codeine, codeine, and neosporin [bacitracin-polymyxin b].  Meds: No current outpatient medications on file.   No current facility-administered medications for this encounter.    Physical Findings: The patient is in no acute distress. Patient is alert and oriented.  vitals were not taken for this visit. .  No significant changes. Lungs are clear to auscultation bilaterally. Heart has regular rate and rhythm. No palpable cervical, supraclavicular, or axillary adenopathy. Abdomen soft, non-tender, normal bowel sounds.  On pelvic examination the external genitalia were unremarkable. A speculum exam was performed. There are no mucosal lesions noted in the vaginal vault. A Pap smear was obtained of the proximal vagina. On bimanual and rectovaginal examination there were no pelvic masses appreciated. ***   Lab Findings: Lab Results  Component Value Date   WBC 6.9 07/26/2019   HGB 12.7 07/26/2019   HCT 38.8 07/26/2019   MCV 90.0  07/26/2019   PLT 336 07/26/2019    Radiographic Findings: No results found.  Impression: Stage III-C endometrial adenocarcinoma   The patient is recovering from the effects of radiation.  ***  Plan:  ***   *** minutes of total time was spent for this patient encounter, including preparation, face-to-face counseling with the patient and coordination of care, physical exam, and documentation of the encounter. ____________________________________  Billie Lade, PhD, MD  This document serves as a record of services personally performed by Antony Blackbird, MD. It was created on his behalf by Neena Rhymes, a trained medical scribe. The creation of this record is based on the scribe's personal observations and the provider's statements to them. This document has been checked and approved by the attending provider.

## 2023-01-19 ENCOUNTER — Ambulatory Visit
Admission: RE | Admit: 2023-01-19 | Discharge: 2023-01-19 | Disposition: A | Discharge status: Home / Self Care | Payer: Medicare Other | Source: Ambulatory Visit | Attending: Radiation Oncology | Admitting: Radiation Oncology

## 2023-01-19 ENCOUNTER — Other Ambulatory Visit: Payer: Self-pay

## 2023-01-19 ENCOUNTER — Encounter: Payer: Self-pay | Admitting: Radiation Oncology

## 2023-01-19 VITALS — BP 132/66 | HR 84 | Temp 97.8°F | Resp 18 | Wt 100.4 lb

## 2023-01-19 DIAGNOSIS — Z8542 Personal history of malignant neoplasm of other parts of uterus: Secondary | ICD-10-CM | POA: Insufficient documentation

## 2023-01-19 DIAGNOSIS — Z923 Personal history of irradiation: Secondary | ICD-10-CM | POA: Diagnosis not present

## 2023-01-19 DIAGNOSIS — C541 Malignant neoplasm of endometrium: Secondary | ICD-10-CM

## 2023-01-19 NOTE — Progress Notes (Signed)
Tracey Oneill is here today for follow up post radiation to the pelvic.  They completed their radiation on: 12/22/11   Does the patient complain of any of the following:  Pain:No Abdominal bloating: No Diarrhea/Constipation: No Nausea/Vomiting: No Vaginal Discharge: No Blood in Urine or Stool: No Urinary Issues (dysuria/incomplete emptying/ incontinence/ increased frequency/urgency): No Does patient report using vaginal dilator 2-3 times a week and/or sexually active 2-3 weeks: NA Post radiation skin changes: No   Additional comments if applicable:    BP 132/66   Pulse 84   Temp 97.8 F (36.6 C)   Resp 18   Wt 100 lb 6.4 oz (45.5 kg)   SpO2 100%   BMI 17.23 kg/m

## 2023-12-12 ENCOUNTER — Encounter: Payer: Self-pay | Admitting: Emergency Medicine

## 2023-12-12 ENCOUNTER — Ambulatory Visit
Admission: EM | Admit: 2023-12-12 | Discharge: 2023-12-12 | Disposition: A | Discharge status: Home / Self Care | Attending: Family Medicine | Admitting: Family Medicine

## 2023-12-12 DIAGNOSIS — R21 Rash and other nonspecific skin eruption: Secondary | ICD-10-CM

## 2023-12-12 MED ORDER — CLOTRIMAZOLE-BETAMETHASONE 1-0.05 % EX CREA: TOPICAL_CREAM | CUTANEOUS | 0 refills | Status: AC

## 2023-12-12 NOTE — ED Provider Notes (Signed)
 [facility]    CSN: [id number] Arrival date & time: 12/12/23  1152      History   Chief Complaint Chief Complaint  Patient presents with   Rash    HPI Tracey Oneill is a 73 y.o. female.    Rash  Patient is here for a rash x 6 weeks.  It started out as a small dry rash to the crease of the thigh/leg.  She bought otc cream (lanocaine), and a topical powder.  It did help for a bit, but not helping currently.   She soaks in epson salt without help.  It is not painful, but is itchy.  It is now a bit swollen.   She used baby wipes, and vagisil.  Itchiness to the pubic area as well.       Past Medical History:  Diagnosis Date   Cancer [facility])    malignant neoplasm of corpus uteri except  isthmus   Cancer (HCC)    History of brachytherapy 10/3,7,12/2011   vaginal intracavitary 4500 cGy 25 fx   History of radiation therapy 11/17/11-12/22/11   endometrial ca / external beam to pelvis   Postmenopausal     Patient Active Problem List   Diagnosis Date Noted   History of malignant neoplasm of uterine body 12/22/2017   Endometrial carcinoma (HCC) 08/26/2011   Pre-operative cardiovascular examination 07/19/2011   Post-menopausal bleeding 06/01/2011    Past Surgical History:  Procedure Laterality Date   ABDOMINAL HYSTERECTOMY     BUNIONECTOMY  1991   LEFT   TUBAL LIGATION      OB History     Gravida  2   Para  2   Term  2   Preterm  0   AB  0   Living  2      SAB  0   IAB  0   Ectopic  0   Multiple      Live Births  2            Home Medications    Prior to Admission medications   Not on File    Family History Family History  Problem Relation Age of Onset   Cancer Sister        COLON   Diabetes Sister        younger   Diabetes Sister        older   Stroke Father    Heart attack Sister 81    Social History Social History   Tobacco Use   Smoking status: Never    Passive exposure: Never   Smokeless tobacco:  Never  Vaping Use   Vaping status: Never Used  Substance Use Topics   Alcohol use: Never   Drug use: Never     Allergies   Garlic, Garlic, Neosporin [neomycin-bacitracin zn-polymyx], Codeine, Codeine, and Neosporin [bacitracin-polymyxin b]   Review of Systems Review of Systems  Constitutional: Negative.   HENT: Negative.    Respiratory: Negative.    Cardiovascular: Negative.   Gastrointestinal: Negative.   Skin:  Positive for rash.     Physical Exam Triage Vital Signs ED Triage Vitals  Encounter Vitals Group     BP 12/12/23 1219 (!) 146/73     Girls Systolic BP Percentile --      Girls Diastolic BP Percentile --      Boys Systolic BP Percentile --      Boys Diastolic BP Percentile --      Pulse Rate 12/12/23 1219  89     Resp 12/12/23 1219 16     Temp 12/12/23 1219 97.7 F (36.5 C)     Temp Source 12/12/23 1219 Oral     SpO2 12/12/23 1219 99 %     Weight 12/12/23 1218 100 lb 15.5 oz (45.8 kg)     Height --      Head Circumference --      Peak Flow --      Pain Score 12/12/23 1217 0     Pain Loc --      Pain Education --      Exclude from Growth Chart --    No data found.  Updated Vital Signs BP (!) 146/73 (BP Location: Left Arm)   Pulse 89   Temp 97.7 F (36.5 C) (Oral)   Resp 16   Wt 45.8 kg   SpO2 99%   BMI 17.33 kg/m   Visual Acuity Right Eye Distance:   Left Eye Distance:   Bilateral Distance:    Right Eye Near:   Left Eye Near:    Bilateral Near:     Physical Exam Constitutional:      Appearance: Normal appearance. She is normal weight.  Skin:    Comments: There is a defined area of redness to the pubic area and into the groins bilaterally with lacy, irregular borders;    Neurological:     Mental Status: She is alert.      UC Treatments / Results  Labs (all labs ordered are listed, but only abnormal results are displayed) Labs Reviewed - No data to display  EKG   Radiology No results found.  Procedures Procedures  (including critical care time)  Medications Ordered in UC Medications - No data to display  Initial Impression / Assessment and Plan / UC Course  I have reviewed the triage vital signs and the nursing notes.  Pertinent labs & imaging results that were available during my care of the patient were reviewed by me and considered in my medical decision making (see chart for details).   Final Clinical Impressions(s) / UC Diagnoses   Final diagnoses:  Rash and nonspecific skin eruption     Discharge Instructions      You were seen today for a rash.  I have sent out a topical cream to use twice/day.   Please return or be seen by your primary care provider if the area is not improving or worsening.     ED Prescriptions     Medication Sig Dispense Auth. Provider   clotrimazole -betamethasone  (LOTRISONE ) cream Apply to affected area 2 times daily prn 15 g Darral Longs, MD      PDMP not reviewed this encounter.   Darral Longs, MD 12/12/23 1235

## 2023-12-12 NOTE — Discharge Instructions (Signed)
 You were seen today for a rash.  I have sent out a topical cream to use twice/day.   Please return or be seen by your primary care provider if the area is not improving or worsening.

## 2023-12-12 NOTE — ED Triage Notes (Signed)
 Pt presents c/o vaginal concern x 1 1/2 months. Pt reports severe itching on inside thighs as well tiny little bumps near panty line. Pt denies any changes in day to day.

## 2024-01-24 NOTE — Progress Notes (Incomplete)
  Radiation Oncology         [phone] ________________________________  Name: Tracey Oneill MRN: [id number]  Date: 01/25/2024  DOB: 1950/11/21  Follow-Up Visit Note  CC: Patient, No Pcp Per  Tracey Delroy SQUIBB, MD  No diagnosis found.  Diagnosis: Stage III-C endometrial adenocarcinoma    Interval Since Last Radiation: 1 year 2 month 4 days    Radiation Treatment Dates:  --External beam: August 22 through December 22, 2011 --Intracavitary brachytherapy treatments: October 3 of October 7 of January 05, 2012   Site/dose: Whole pelvis 4500 cGy in 25 fractions. The vaginal cuff area received a cumulative brachytherapy dose of 1800 cGy  Narrative:  The patient returns today for routine annual follow-up. She was last seen in office on 01/19/23 for a follow up visit. Patient continued to follow up with their specialists to manage their chronic conditions.   In the interval since she was last seen, she presented to the ED on 12/12/23 for a rash that had persisted for the prior 6 weeks for which she was given topical cream for.   No other significant oncologic interval history since the patient was last seen.     Allergies:  is allergic to garlic, garlic, neosporin [neomycin-bacitracin zn-polymyx], codeine, codeine, and neosporin [bacitracin-polymyxin b].  Meds: Current Outpatient Medications  Medication Sig Dispense Refill   clotrimazole -betamethasone  (LOTRISONE ) cream Apply to affected area 2 times daily prn 15 g 0   No current facility-administered medications for this visit.    Physical Findings: The patient is in no acute distress. Patient is alert and oriented.  vitals were not taken for this visit. .  No significant changes. Lungs are clear to auscultation bilaterally. Heart has regular rate and rhythm. No palpable cervical, supraclavicular, or axillary adenopathy. Abdomen soft, non-tender, normal bowel sounds.   Lab Findings: Lab Results  Component Value Date   WBC  6.9 07/26/2019   HGB 12.7 07/26/2019   HCT 38.8 07/26/2019   MCV 90.0 07/26/2019   PLT 336 07/26/2019    Radiographic Findings: No results found.  Impression:  Stage III-C endometrial adenocarcinoma  The patient is recovering from the effects of radiation.  ***  Plan:  ***   *** minutes of total time was spent for this patient encounter, including preparation, face-to-face counseling with the patient and coordination of care, physical exam, and documentation of the encounter. ____________________________________  Lynwood CHARM Nasuti, PhD, MD  This document serves as a record of services personally performed by Lynwood Nasuti, MD. It was created on his behalf by Reymundo Cartwright, a trained medical scribe. The creation of this record is based on the scribe's personal observations and the provider's statements to them. This document has been checked and approved by the attending provider.

## 2024-01-25 ENCOUNTER — Encounter: Payer: Self-pay | Admitting: Radiation Oncology

## 2024-01-25 ENCOUNTER — Ambulatory Visit
Admission: RE | Admit: 2024-01-25 | Discharge: 2024-01-25 | Disposition: A | Discharge status: Home / Self Care | Payer: Self-pay | Source: Ambulatory Visit | Attending: Radiation Oncology | Admitting: Radiation Oncology

## 2024-01-25 VITALS — BP 112/60 | HR 100 | Temp 98.2°F | Resp 20 | Wt 100.6 lb

## 2024-01-25 DIAGNOSIS — Z9221 Personal history of antineoplastic chemotherapy: Secondary | ICD-10-CM | POA: Diagnosis not present

## 2024-01-25 DIAGNOSIS — Z923 Personal history of irradiation: Secondary | ICD-10-CM | POA: Insufficient documentation

## 2024-01-25 DIAGNOSIS — Z8542 Personal history of malignant neoplasm of other parts of uterus: Secondary | ICD-10-CM | POA: Diagnosis present

## 2024-01-25 DIAGNOSIS — C541 Malignant neoplasm of endometrium: Secondary | ICD-10-CM

## 2024-01-25 NOTE — Progress Notes (Signed)
 Tracey Oneill is here today for follow up post radiation to the pelvis.  They completed their radiation on: 12/22/11   Does the patient complain of any of the following:  Pain:No Abdominal bloating: No Diarrhea/Constipation: No Nausea/Vomiting: No Vaginal Discharge: No Blood in Urine or Stool: No Urinary Issues (dysuria/incomplete emptying/ incontinence/ increased frequency/urgency): No Does patient report using vaginal dilator 2-3 times a week and/or sexually active 2-3 weeks:  Post radiation skin changes:  Reports severe itching to vagina and swelling to groin since last vaginal exam. Patient was placed on a steroid which provided some relief.    Additional comments if applicable:    BP 112/60 (BP Location: Left Arm, Patient Position: Sitting)   Pulse 100   Temp 98.2 F (36.8 C) (Oral)   Resp 20   Wt 100 lb 9.6 oz (45.6 kg)   SpO2 100%   BMI 17.27 kg/m

## 2025-01-23 ENCOUNTER — Ambulatory Visit: Admitting: Radiation Oncology
